# Patient Record
Sex: Female | Born: 1975 | Race: Black or African American | Hispanic: No | Marital: Single | Smoking: Current every day smoker
Health system: Southern US, Community
[De-identification: ages and names within clinical notes are randomized; demographics above are authoritative.]

## PROBLEM LIST (undated history)

## (undated) DIAGNOSIS — F419 Anxiety disorder, unspecified: Secondary | ICD-10-CM

## (undated) DIAGNOSIS — R200 Anesthesia of skin: Secondary | ICD-10-CM

## (undated) DIAGNOSIS — F329 Major depressive disorder, single episode, unspecified: Secondary | ICD-10-CM

## (undated) DIAGNOSIS — G4730 Sleep apnea, unspecified: Secondary | ICD-10-CM

## (undated) DIAGNOSIS — J45909 Unspecified asthma, uncomplicated: Secondary | ICD-10-CM

## (undated) DIAGNOSIS — F32A Depression, unspecified: Secondary | ICD-10-CM

## (undated) DIAGNOSIS — I82409 Acute embolism and thrombosis of unspecified deep veins of unspecified lower extremity: Secondary | ICD-10-CM

## (undated) DIAGNOSIS — J40 Bronchitis, not specified as acute or chronic: Secondary | ICD-10-CM

## (undated) DIAGNOSIS — I10 Essential (primary) hypertension: Secondary | ICD-10-CM

## (undated) HISTORY — DX: Anesthesia of skin: R20.0

## (undated) HISTORY — DX: Depression, unspecified: F32.A

## (undated) HISTORY — PX: TUBAL LIGATION: SHX77

## (undated) HISTORY — DX: Sleep apnea, unspecified: G47.30

## (undated) HISTORY — DX: Unspecified asthma, uncomplicated: J45.909

## (undated) HISTORY — DX: Major depressive disorder, single episode, unspecified: F32.9

## (undated) HISTORY — DX: Anxiety disorder, unspecified: F41.9

## (undated) HISTORY — PX: WRIST SURGERY: SHX841

---

## 2004-11-26 ENCOUNTER — Inpatient Hospital Stay (HOSPITAL_COMMUNITY): Admission: AD | Admit: 2004-11-26 | Discharge: 2004-11-29 | Payer: Self-pay | Admitting: Obstetrics & Gynecology

## 2004-11-28 ENCOUNTER — Encounter (INDEPENDENT_AMBULATORY_CARE_PROVIDER_SITE_OTHER): Payer: Self-pay | Admitting: *Deleted

## 2006-08-11 ENCOUNTER — Emergency Department (HOSPITAL_COMMUNITY): Admission: EM | Admit: 2006-08-11 | Discharge: 2006-08-11 | Payer: Self-pay | Admitting: Family Medicine

## 2007-10-25 ENCOUNTER — Emergency Department (HOSPITAL_COMMUNITY): Admission: EM | Admit: 2007-10-25 | Discharge: 2007-10-25 | Payer: Self-pay | Admitting: Family Medicine

## 2007-11-28 ENCOUNTER — Other Ambulatory Visit: Admission: RE | Admit: 2007-11-28 | Discharge: 2007-11-28 | Payer: Self-pay | Admitting: Endocrinology

## 2010-07-16 ENCOUNTER — Emergency Department (HOSPITAL_COMMUNITY): Admission: EM | Admit: 2010-07-16 | Discharge: 2010-07-16 | Payer: Self-pay | Admitting: Emergency Medicine

## 2010-12-25 LAB — DIFFERENTIAL
Basophils Absolute: 0 10*3/uL (ref 0.0–0.1)
Basophils Relative: 0 % (ref 0–1)
Eosinophils Relative: 2 % (ref 0–5)
Lymphocytes Relative: 26 % (ref 12–46)
Monocytes Absolute: 0.4 10*3/uL (ref 0.1–1.0)
Monocytes Relative: 6 % (ref 3–12)
Neutro Abs: 4.7 10*3/uL (ref 1.7–7.7)

## 2010-12-25 LAB — POCT CARDIAC MARKERS
Myoglobin, poc: 25.9 ng/mL (ref 12–200)
Troponin i, poc: <0.05 ng/mL (ref 0.00–0.09)

## 2010-12-25 LAB — CBC
HCT: 36.6 % (ref 36.0–46.0)
Hemoglobin: 12 g/dL (ref 12.0–15.0)
MCHC: 32.8 g/dL (ref 30.0–36.0)
MCV: 80.6 fL (ref 78.0–100.0)
RDW: 14.2 % (ref 11.5–15.5)

## 2010-12-25 LAB — BASIC METABOLIC PANEL
BUN: 10 mg/dL (ref 6–23)
Chloride: 108 mEq/L (ref 96–112)
GFR calc non Af Amer: >60 mL/min (ref >60)
Glucose, Bld: 120 mg/dL — ABNORMAL HIGH (ref 70–99)
Potassium: 4 mEq/L (ref 3.5–5.1)
Sodium: 137 mEq/L (ref 135–145)

## 2011-02-27 NOTE — Op Note (Signed)
Rachael Paul, Rachael Paul             ACCOUNT NO.:  [id number]   MEDICAL RECORD NO.:  [id number]          PATIENT TYPE:  INP   LOCATION:  9107                          FACILITY:  [facility]   PHYSICIAN:  Roseanna Rainbow, M.D.DATE OF BIRTH:  [birth year]   DATE OF PROCEDURE:  11/28/2004  DATE OF DISCHARGE:                                 OPERATIVE REPORT   PREOPERATIVE DIAGNOSIS:  Multiparity, desires sterilization   POSTOPERATIVE DIAGNOSIS:  Multiparity, desires sterilization   PROCEDURE:  Modified Pomeroy bilateral tubal ligation.   SURGEON:  Roseanna Rainbow, M.D.   ANESTHESIA:  General tracheal.   ESTIMATED BLOOD LOSS:  Less than 50 mL.   COMPLICATIONS:  None.   IV FLUIDS:  As per anesthesiology.   PROCEDURE:  The patient was taken to the operating room.  General anesthesia  was induced without difficulty.  She was placed in the dorsal supine  position and prepped and draped in the usual sterile fashion.  An  infraumbilical skin incision was then made with the scalpel and carried down  to the underlying fascia.  The fascia was tented up and entered sharply with Mayo scissors.  This  incision was then extended bilaterally.  The parietal peritoneum was tented  up and entered sharply.  This incision was then extended as well and the  peritoneal incision was felt to be free of any adhesions.  Retractors were  then placed into the incision.  The right fallopian tube was then grasped  with a Babcock clamp and followed out to the fimbriated end.  The midisthmic  portion of the tube was then regrasped.  Two ligatures of 0 plain were then  placed and the segment of tube excised.  Adequate hemostasis was noted.  The  tube was returned to the abdomen.  The left fallopian tube was manipulated  in a similar fashion.  The fascia and parietal peritoneum were  reapproximated in a single layer using 0 Vicryl in a running fashion.  The  skin was reapproximated in a subcuticular fashion  using 3-0 Vicryl.  At the  close of procedure, the instrument and pack counts were said to be correct  x2.  The patient was taken to the PACU awake and in stable condition.      LAJ/MEDQ  D:  11/28/2004  T:  11/28/2004  Job:  [id number]

## 2011-07-02 LAB — WET PREP, GENITAL

## 2011-07-02 LAB — POCT PREGNANCY, URINE
Operator id: 235561
Preg Test, Ur: NEGATIVE

## 2011-07-02 LAB — POCT URINALYSIS DIP (DEVICE)
Hgb urine dipstick: NEGATIVE
Nitrite: NEGATIVE
Urobilinogen, UA: 0.2
pH: 5.5

## 2011-07-02 LAB — RPR: RPR Ser Ql: NONREACTIVE

## 2011-07-02 LAB — HIV ANTIBODY (ROUTINE TESTING W REFLEX): HIV: NONREACTIVE

## 2011-12-26 ENCOUNTER — Emergency Department (HOSPITAL_COMMUNITY)
Admission: EM | Admit: 2011-12-26 | Discharge: 2011-12-26 | Disposition: A | Discharge status: Home / Self Care | Payer: Self-pay

## 2011-12-26 NOTE — ED Notes (Signed)
Called for the pt with no answer 

## 2011-12-26 NOTE — ED Notes (Signed)
Called pt name to get vital signs and she did not answer.

## 2011-12-26 NOTE — ED Notes (Signed)
Pt does not answer in triage or wait

## 2011-12-27 ENCOUNTER — Emergency Department (INDEPENDENT_AMBULATORY_CARE_PROVIDER_SITE_OTHER)
Admission: EM | Admit: 2011-12-27 | Discharge: 2011-12-27 | Disposition: A | Discharge status: Home / Self Care | Payer: 59 | Source: Home / Self Care | Attending: Family Medicine | Admitting: Family Medicine

## 2011-12-27 ENCOUNTER — Encounter (HOSPITAL_COMMUNITY): Payer: Self-pay

## 2011-12-27 DIAGNOSIS — H6091 Unspecified otitis externa, right ear: Secondary | ICD-10-CM

## 2011-12-27 DIAGNOSIS — H60399 Other infective otitis externa, unspecified ear: Secondary | ICD-10-CM

## 2011-12-27 MED ORDER — NEOMYCIN-POLYMYXIN-HC 3.5-10000-1 OT SOLN: 3.0000 [drp] | Freq: Four times a day (QID) | OTIC | Status: AC

## 2011-12-27 MED ORDER — SULFAMETHOXAZOLE-TRIMETHOPRIM 800-160 MG PO TABS: 1.0000 | ORAL_TABLET | Freq: Two times a day (BID) | ORAL | Status: AC

## 2011-12-27 MED ORDER — IBUPROFEN 600 MG PO TABS: 600.0000 mg | ORAL_TABLET | Freq: Three times a day (TID) | ORAL | Status: AC | PRN

## 2011-12-27 NOTE — ED Provider Notes (Signed)
History     CSN: [id number]  Arrival date & time 12/27/11  1805   First MD Initiated Contact with Patient 12/27/11 1810      Chief Complaint  Patient presents with  . Otalgia    (Consider location/radiation/quality/duration/timing/severity/associated sxs/prior treatment) HPI Comments: 36 year old nondiabetic female here complaining of right ear pain with radiation to the right side of the head and the neck. Decreased hearing on that side. Symptoms present for 3 days. Feels like a throbbing sensation in her right ear canal. States she has used hairpins and Q-tips to remove wax from her ears. No drainage. No fever or chills. Denies sore throat, sinus or cold symptoms.   History reviewed. No pertinent past medical history.  History reviewed. No pertinent past surgical history.  History reviewed. No pertinent family history.  History  Substance Use Topics  . Smoking status: Current Everyday Smoker  . Smokeless tobacco: Not on file  . Alcohol Use: Yes    OB History    Grav Para Term Preterm Abortions TAB SAB Ect Mult Living                  Review of Systems  Constitutional: Negative for chills.  HENT: Positive for ear pain. Negative for congestion, sore throat and sinus pressure.   Neurological: Positive for headaches. Negative for dizziness.    Allergies  Review of patient's allergies indicates no known allergies.  Home Medications   Current Outpatient Rx  Name Route Sig Dispense Refill  . IBUPROFEN 200 MG PO TABS Oral Take 200 mg by mouth every 6 (six) hours as needed.    . IBUPROFEN 600 MG PO TABS Oral Take 1 tablet (600 mg total) by mouth every 8 (eight) hours as needed for pain. 30 tablet 0  . NEOMYCIN-POLYMYXIN-HC 3.5-10000-1 OT SOLN Right Ear Place 3 drops into the right ear 4 (four) times daily. 10 mL 0  . SULFAMETHOXAZOLE-TRIMETHOPRIM 800-160 MG PO TABS Oral Take 1 tablet by mouth 2 (two) times daily. 14 tablet 0    BP 169/85  Pulse 97  Temp(Src) 98 F  (36.7 C) (Oral)  Resp 18  SpO2 100%  LMP 12/14/2011  Physical Exam  Nursing note and vitals reviewed. Constitutional: She is oriented to person, place, and time. She appears well-developed and well-nourished.  HENT:  Head: Normocephalic and atraumatic.  Left Ear: External ear normal.  Nose: Nose normal.  Mouth/Throat: Oropharynx is clear and moist. No oropharyngeal exudate.       Right ear canal is swollen. difficult exam as swelling in narrowing the canal. Tender soft area at the entrance of the ear canal impress a small abscess. No spontaneous drainage no significant erythema or exudates. No pain with palpation over mastoid process.   Eyes: EOM are normal. Pupils are equal, round, and reactive to light.  Cardiovascular: Normal heart sounds.   Pulmonary/Chest: Breath sounds normal.  Lymphadenopathy:    She has no cervical adenopathy.  Neurological: She is alert and oriented to person, place, and time.    ED Course  Procedures (including critical care time)  Labs Reviewed - No data to display No results found.   1. External otitis of right ear       MDM  Ear canal appear swollen but dry. Likely small abscess versus external otitis. Prescribe Septra DS twice a day for 7 days, 600 mg ibuprofen 3 times a day when necessary and Cortisporin otic solution. Asked to return or followup with the ENT if no improvement  of her symptoms in 36-48 hours or return earlier if fever or worsening symptoms despite following treatment.        Sharin Grave, MD 12/29/11 0021

## 2011-12-27 NOTE — ED Notes (Signed)
pt

## 2011-12-27 NOTE — ED Notes (Signed)
Pt has rt sided ear pain for three days, rt side of head and neck aching.

## 2011-12-27 NOTE — Discharge Instructions (Signed)
My impression is that you have an infection like a pimple injure her right ear canal. Use/take the prescribed medications as instructed. In the future avoid cleaning your ears with Q-tips or putting any objects inside your ear canal. Followup with the ENT specialist if worsening pain or swelling after at least 48 hours of treatment. Return earlier if new onset of fever or worsening symptoms despite following treatment.

## 2013-12-04 ENCOUNTER — Other Ambulatory Visit: Payer: Self-pay | Admitting: Physician Assistant

## 2013-12-04 DIAGNOSIS — Z1231 Encounter for screening mammogram for malignant neoplasm of breast: Secondary | ICD-10-CM

## 2013-12-13 ENCOUNTER — Ambulatory Visit
Admission: RE | Admit: 2013-12-13 | Discharge: 2013-12-13 | Disposition: A | Discharge status: Home / Self Care | Payer: 59 | Source: Ambulatory Visit | Attending: Physician Assistant | Admitting: Physician Assistant

## 2013-12-13 DIAGNOSIS — Z1231 Encounter for screening mammogram for malignant neoplasm of breast: Secondary | ICD-10-CM

## 2013-12-14 ENCOUNTER — Other Ambulatory Visit: Payer: Self-pay | Admitting: Physician Assistant

## 2013-12-14 DIAGNOSIS — N63 Unspecified lump in unspecified breast: Secondary | ICD-10-CM

## 2014-01-23 ENCOUNTER — Inpatient Hospital Stay: Admission: RE | Admit: 2014-01-23 | Payer: 59 | Source: Ambulatory Visit

## 2014-02-21 ENCOUNTER — Encounter (HOSPITAL_COMMUNITY): Payer: Self-pay | Admitting: Emergency Medicine

## 2014-02-21 ENCOUNTER — Emergency Department (INDEPENDENT_AMBULATORY_CARE_PROVIDER_SITE_OTHER)
Admission: EM | Admit: 2014-02-21 | Discharge: 2014-02-21 | Disposition: A | Discharge status: Home / Self Care | Payer: 59 | Source: Home / Self Care | Attending: Emergency Medicine | Admitting: Emergency Medicine

## 2014-02-21 DIAGNOSIS — H6690 Otitis media, unspecified, unspecified ear: Secondary | ICD-10-CM

## 2014-02-21 HISTORY — DX: Essential (primary) hypertension: I10

## 2014-02-21 HISTORY — DX: Bronchitis, not specified as acute or chronic: J40

## 2014-02-21 MED ORDER — AMOXICILLIN 500 MG PO CAPS: 500.0000 mg | ORAL_CAPSULE | Freq: Three times a day (TID) | ORAL | Status: DC

## 2014-02-21 NOTE — Discharge Instructions (Signed)
Otitis Media, Adult Otitis media is redness, soreness, and swelling (inflammation) of the middle ear. Otitis media may be caused by allergies or, most commonly, by infection. Often it occurs as a complication of the common cold. SIGNS AND SYMPTOMS Symptoms of otitis media may include:  Earache.  Fever.  Ringing in your ear.  Headache.  Leakage of fluid from the ear. DIAGNOSIS To diagnose otitis media, your health care provider will examine your ear with an otoscope. This is an instrument that allows your health care provider to see into your ear in order to examine your eardrum. Your health care provider also will ask you questions about your symptoms. TREATMENT  Typically, otitis media resolves on its own within 3 5 days. Your health care provider may prescribe medicine to ease your symptoms of pain. If otitis media does not resolve within 5 days or is recurrent, your health care provider may prescribe antibiotic medicines if he or she suspects that a bacterial infection is the cause. HOME CARE INSTRUCTIONS   Take your medicine as directed until it is gone, even if you feel better after the first few days.  Only take over-the-counter or prescription medicines for pain, discomfort, or fever as directed by your health care provider.  Follow up with your health care provider as directed. SEEK MEDICAL CARE IF:  You have otitis media only in one ear or bleeding from your nose or both.  You notice a lump on your neck.  You are not getting better in 3 5 days.  You feel worse instead of better. SEEK IMMEDIATE MEDICAL CARE IF:   You have pain that is not controlled with medicine.  You have swelling, redness, or pain around your ear or stiffness in your neck.  You notice that part of your face is paralyzed.  You notice that the bone behind your ear (mastoid) is tender when you touch it. MAKE SURE YOU:   Understand these instructions.  Will watch your condition.  Will get help  right away if you are not doing well or get worse. Document Released: 07/03/2004 Document Revised: 07/19/2013 Document Reviewed: 04/25/2013 Exit[employer] Patient Information 2014 [employer].  

## 2014-02-21 NOTE — ED Provider Notes (Signed)
CSN: [id number]     Arrival date & time 02/21/14  8768 History   First MD Initiated Contact with Patient 02/21/14 [phone]     No chief complaint on file.  (Consider location/radiation/quality/duration/timing/severity/associated sxs/prior Treatment) Patient is a 38 y.o. female presenting with ear pain. The history is provided by the patient. No language interpreter was used.  Otalgia Location:  Left Severity:  Moderate Onset quality:  Gradual Duration:  4 days Timing:  Constant Progression:  Worsening Chronicity:  New Relieved by:  Nothing Worsened by:  Nothing tried Ineffective treatments:  None tried Associated symptoms: no hearing loss     No past medical history on file. No past surgical history on file. No family history on file. History  Substance Use Topics  . Smoking status: Current Every Day Smoker  . Smokeless tobacco: Not on file  . Alcohol Use: Yes   OB History   Grav Para Term Preterm Abortions TAB SAB Ect Mult Living                 Review of Systems  HENT: Positive for ear pain. Negative for hearing loss.   All other systems reviewed and are negative.   Allergies  Review of patient's allergies indicates no known allergies.  Home Medications   Prior to Admission medications   Medication Sig Start Date End Date Taking? Authorizing Provider  ibuprofen (ADVIL,MOTRIN) 200 MG tablet Take 200 mg by mouth every 6 (six) hours as needed.    Historical Provider, MD   BP 181/106  Pulse 88  Temp(Src) 98.4 F (36.9 C) (Oral)  Resp 18  SpO2 100% Physical Exam  Nursing note and vitals reviewed. Constitutional: She is oriented to person, place, and time. She appears well-developed and well-nourished.  HENT:  Head: Normocephalic.  Right Ear: External ear normal.  Mouth/Throat: Oropharynx is clear and moist.  lefttm erythematous ring, pimple in canal  Eyes: Conjunctivae and EOM are normal. Pupils are equal, round, and reactive to light.  Neck: Normal range of  motion.  Cardiovascular: Normal rate.   Pulmonary/Chest: Effort normal.  Musculoskeletal: Normal range of motion.  Neurological: She is alert and oriented to person, place, and time.  Psychiatric: She has a normal mood and affect.    ED Course  Procedures (including critical care time) Labs Review Labs Reviewed - No data to display  Imaging Review No results found.   MDM   1. Otitis media    Emlyn, PA-C 02/21/14 1024

## 2014-02-21 NOTE — ED Provider Notes (Signed)
Medical screening examination/treatment/procedure(s) were performed by non-physician practitioner and as supervising physician I was immediately available for consultation/collaboration.  Philipp Deputy, M.D.  Harden Mo, MD 02/21/14 1058

## 2014-02-21 NOTE — ED Notes (Signed)
C/o ear ache, onset 2 weeks ago

## 2015-04-04 ENCOUNTER — Encounter (HOSPITAL_COMMUNITY): Payer: Self-pay | Admitting: *Deleted

## 2015-04-04 ENCOUNTER — Inpatient Hospital Stay (HOSPITAL_COMMUNITY)
Admission: AD | Admit: 2015-04-04 | Discharge: 2015-04-06 | DRG: 885 | Disposition: A | Discharge status: Home / Self Care | Payer: Federal, State, Local not specified - Other | Attending: Psychiatry | Admitting: Psychiatry

## 2015-04-04 DIAGNOSIS — F322 Major depressive disorder, single episode, severe without psychotic features: Secondary | ICD-10-CM | POA: Diagnosis present

## 2015-04-04 DIAGNOSIS — I10 Essential (primary) hypertension: Secondary | ICD-10-CM | POA: Diagnosis present

## 2015-04-04 DIAGNOSIS — F17210 Nicotine dependence, cigarettes, uncomplicated: Secondary | ICD-10-CM | POA: Diagnosis present

## 2015-04-04 DIAGNOSIS — R45851 Suicidal ideations: Secondary | ICD-10-CM | POA: Diagnosis present

## 2015-04-04 DIAGNOSIS — Z634 Disappearance and death of family member: Secondary | ICD-10-CM | POA: Insufficient documentation

## 2015-04-04 DIAGNOSIS — F329 Major depressive disorder, single episode, unspecified: Secondary | ICD-10-CM | POA: Diagnosis present

## 2015-04-04 MED ORDER — TRAZODONE HCL 100 MG PO TABS
100.0000 mg | ORAL_TABLET | Freq: Every evening | ORAL | Status: DC | PRN
  Administered 2015-04-04 – 2015-04-05 (×2): 100 mg via ORAL
  Filled 2015-04-04 (×2): qty 1

## 2015-04-04 MED ORDER — NICOTINE 21 MG/24HR TD PT24
21.0000 mg | MEDICATED_PATCH | Freq: Every day | TRANSDERMAL | Status: DC
  Administered 2015-04-04 – 2015-04-06 (×3): 21 mg via TRANSDERMAL
  Filled 2015-04-04 (×7): qty 1

## 2015-04-04 MED ORDER — ALUM & MAG HYDROXIDE-SIMETH 200-200-20 MG/5ML PO SUSP: 30.0000 mL | ORAL | Status: DC | PRN

## 2015-04-04 MED ORDER — HYDROXYZINE HCL 25 MG PO TABS
25.0000 mg | ORAL_TABLET | Freq: Three times a day (TID) | ORAL | Status: DC | PRN
  Administered 2015-04-04 – 2015-04-05 (×2): 25 mg via ORAL
  Filled 2015-04-04: qty 1
  Filled 2015-04-04: qty 20
  Filled 2015-04-04: qty 1

## 2015-04-04 MED ORDER — ACETAMINOPHEN 325 MG PO TABS: 650.0000 mg | ORAL_TABLET | Freq: Four times a day (QID) | ORAL | Status: DC | PRN

## 2015-04-04 MED ORDER — MAGNESIUM HYDROXIDE 400 MG/5ML PO SUSP: 30.0000 mL | Freq: Every day | ORAL | Status: DC | PRN

## 2015-04-04 NOTE — Tx Team (Signed)
Initial Interdisciplinary Treatment Plan   PATIENT STRESSORS: Health problems Loss of son who died recently Medication change or noncompliance Substance abuse   PATIENT STRENGTHS: Ability for insight Capable of independent living Occupational psychologist fund of knowledge Motivation for treatment/growth Religious Affiliation Supportive family/friends Work skills   PROBLEM LIST: Problem List/Patient Goals Date to be addressed Date deferred Reason deferred Estimated date of resolution  "I need help with depression and anxiety" 04-04-15     "I drink couple times a week" 04-04-15                                                DISCHARGE CRITERIA:  Ability to meet basic life and health needs Improved stabilization in mood, thinking, and/or behavior Medical problems require only outpatient monitoring Verbal commitment to aftercare and medication compliance  PRELIMINARY DISCHARGE PLAN: Attend aftercare/continuing care group Outpatient therapy Participate in family therapy Return to previous living arrangement Return to previous work or school arrangements  PATIENT/FAMIILY INVOLVEMENT: This treatment plan has been presented to and reviewed with the patient, Rachael Paul, and/or family member.  The patient and family have been given the opportunity to ask questions and make suggestions.  Rachael Paul 04/04/2015, 8:40 PM

## 2015-04-04 NOTE — Progress Notes (Signed)
Patient ID: Rachael Paul, female   DOB: [birth year], 39 y.o.   MRN: [id number] Client is 39 yo female admitted voluntarily with complaints " I need help with depression and anxiety" client reports her 3 year old son died in 03-13-23 unexpectedly, reports show he died of a blood clot in his leg. Client is grieving his death. She has 3 other children 21, 57, 11 with whom she lives. Client has a medical history of HTN and bronchitis. This client first admission to [facility].  Client reports she drinks 2-3 glasses of wine couple times a week. Client denies SHI. Client received food/drink, oriented to unit/room. Staff will monitor q45min for safety.

## 2015-04-04 NOTE — Progress Notes (Signed)
Pt did not attend karaoke group this evening.  

## 2015-04-04 NOTE — BH Assessment (Signed)
Assessment Note  Rachael Paul is an 39 y.o. female that reports SI.  Patient reports that her son died on 2015-02-18 due to a blood clot in his leg.  Patient reports that she has been tearful and not able to work.  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  Patient reports that she has 3 other children ages 65,16 and 52.   Patient reports feelings of hopelessness.  Patient reports that she was prescribed prozac by her primary care physician.  Patient reports that she has been see by the EAP counselor at work.  Patient reports that she is having difficulty with managing day to day activities.  Patient reports that she is not able to sleep without some type of sleeping medication.    Patient denies HI/Psychosis/Substance Abuse.  Patient denies physical, sexual or emotional abuse.     Axis I: Major Depression, single episode Axis II: Deferred Axis III:  Past Medical History  Diagnosis Date  . Hypertension   . Bronchitis    Axis IV: other psychosocial or environmental problems and problems related to social environment Axis V: 31-40 impairment in reality testing  Past Medical History:  Past Medical History  Diagnosis Date  . Hypertension   . Bronchitis     No past surgical history on file.  Family History: No family history on file.  Social History:  reports that she has been smoking.  She does not have any smokeless tobacco history on file. She reports that she drinks alcohol. She reports that she does not use illicit drugs.  Additional Social History:  Alcohol / Drug Use History of alcohol / drug use?: No history of alcohol / drug abuse  CIWA:   COWS:    Allergies: No Known Allergies  Home Medications:  Medications Prior to Admission  Medication Sig Dispense Refill  . amoxicillin (AMOXIL) 500 MG capsule Take 1 capsule (500 mg total) by mouth 3 (three) times daily. 30 capsule 0  . ibuprofen (ADVIL,MOTRIN) 200 MG tablet  Take 200 mg by mouth every 6 (six) hours as needed.      OB/GYN Status:  No LMP recorded.  General Assessment Data Location of Assessment: [facility] ([facility]) TTS Assessment: In system Is this a Tele or Face-to-Face Assessment?: Face-to-Face Is this an Initial Assessment or a Re-assessment for this encounter?: Initial Assessment Marital status: Single Maiden name: NA Is patient pregnant?: No Pregnancy Status: No Living Arrangements: Children Can pt return to current living arrangement?: Yes Admission Status: Voluntary Is patient capable of signing voluntary admission?: Yes Referral Source: Self/Family/Friend Insurance type: Medicaid  Medical Screening Exam ([place]) Medical Exam completed: Yes  Crisis Care Plan Living Arrangements: Children Name of Psychiatrist: NA Name of Therapist: NA  Education Status Is patient currently in school?: No Current Grade: NA Highest grade of school patient has completed: NA Name of school: NA Contact person: NA  Risk to self with the past 6 months Suicidal Ideation: Yes-Currently Present Has patient been a risk to self within the past 6 months prior to admission? : No Suicidal Intent: Yes-Currently Present Has patient had any suicidal intent within the past 6 months prior to admission? : No Is patient at risk for suicide?: Yes Suicidal Plan?: No Has patient had any suicidal plan within the past 6 months prior to admission? : No Specify Current Suicidal Plan: NA Access to Means: No What has been your use  of drugs/alcohol within the last 12 months?: NA Previous Attempts/Gestures: No How many times?: 0 Other Self Harm Risks: NA Triggers for Past Attempts: None known Intentional Self Injurious Behavior: None Family Suicide History: No Recent stressful life event(s): Loss (Comment) (Son died on 12-Feb-2015) Persecutory voices/beliefs?: No Depression: Yes Depression Symptoms: Despondent, Insomnia,  Tearfulness, Isolating, Guilt, Fatigue, Loss of interest in usual pleasures, Feeling worthless/self pity Substance abuse history and/or treatment for substance abuse?: No Suicide prevention information given to non-admitted patients: Yes  Risk to Others within the past 6 months Homicidal Ideation: No Does patient have any lifetime risk of violence toward others beyond the six months prior to admission? : No Thoughts of Harm to Others: No Current Homicidal Intent: No Current Homicidal Plan: No Access to Homicidal Means: No Identified Victim: NA History of harm to others?: No Assessment of Violence: None Noted Violent Behavior Description: NA Does patient have access to weapons?: No Criminal Charges Pending?: No Does patient have a court date: No Is patient on probation?: No  Psychosis Hallucinations: None noted Delusions: None noted  Mental Status Report Appearance/Hygiene: Disheveled Eye Contact: Poor Motor Activity: Freedom of movement, Restlessness Speech: Logical/coherent, Slow Level of Consciousness: Alert, Restless Mood: Depressed, Anxious Affect: Anxious, Depressed Anxiety Level: Minimal Thought Processes: Coherent, Relevant Judgement: Unimpaired Orientation: Person, Place, Time, Situation Obsessive Compulsive Thoughts/Behaviors: None  Cognitive Functioning Concentration: Decreased Memory: Remote Intact, Recent Intact IQ: Average Insight: Fair Impulse Control: Poor Appetite: Fair Weight Loss: 0 Weight Gain: 0 Sleep: Increased Total Hours of Sleep: 11 Vegetative Symptoms: Decreased grooming, Staying in bed  ADLScreening [facility] Assessment Services) Patient's cognitive ability adequate to safely complete daily activities?: Yes Patient able to express need for assistance with ADLs?: Yes Independently performs ADLs?: Yes (appropriate for developmental age)  Prior Inpatient Therapy Prior Inpatient Therapy: No Prior Therapy Dates: NA Prior Therapy  Facilty/Provider(s): NA Reason for Treatment: NA  Prior Outpatient Therapy Prior Outpatient Therapy: No Prior Therapy Dates: NA Prior Therapy Facilty/Provider(s): NA Reason for Treatment: NA Does patient have an ACCT team?: No Does patient have Intensive In-House Services?  : No Does patient have [facility] services? : No Does patient have P4CC services?: No  ADL Screening (condition at time of admission) Patient's cognitive ability adequate to safely complete daily activities?: Yes Is the patient deaf or have difficulty hearing?: No Does the patient have difficulty seeing, even when wearing glasses/contacts?: No Does the patient have difficulty concentrating, remembering, or making decisions?: No Patient able to express need for assistance with ADLs?: Yes Does the patient have difficulty dressing or bathing?: No Independently performs ADLs?: Yes (appropriate for developmental age) Does the patient have difficulty walking or climbing stairs?: No Weakness of Legs: None Weakness of Arms/Hands: None  Home Assistive Devices/Equipment Home Assistive Devices/Equipment: None    Abuse/Neglect Assessment (Assessment to be complete while patient is alone) Physical Abuse: Denies Verbal Abuse: Denies Sexual Abuse: Denies Exploitation of patient/patient's resources: Denies Self-Neglect: Denies Values / Beliefs Cultural Requests During Hospitalization: None Spiritual Requests During Hospitalization: None Consults Spiritual Care Consult Needed: No Social Work Consult Needed: No Regulatory affairs officer (For Healthcare) Does patient have an advance directive?: No Would patient like information on creating an advanced directive?: No - patient declined information    Additional Information 1:1 In Past 12 Months?: No CIRT Risk: No Elopement Risk: No Does patient have medical clearance?: Yes     Disposition: Per [place], NP - meets criteria for inpatient hospitalization. Disposition Initial  Assessment Completed for this Encounter:  Yes Disposition of Patient: Inpatient treatment program (Patient meets criteria for inpt hosp )  On Site Evaluation by:   Reviewed with Physician:    Graciella Freer [place] 04/04/2015 7:00 PM

## 2015-04-05 ENCOUNTER — Encounter (HOSPITAL_COMMUNITY): Payer: Self-pay | Admitting: Psychiatry

## 2015-04-05 LAB — TSH: TSH: 2.81 u[IU]/mL (ref 0.350–4.500)

## 2015-04-05 MED ORDER — FLUOXETINE HCL 20 MG PO CAPS
20.0000 mg | ORAL_CAPSULE | Freq: Every day | ORAL | Status: DC
  Administered 2015-04-05 – 2015-04-06 (×2): 20 mg via ORAL
  Filled 2015-04-05 (×5): qty 1

## 2015-04-05 MED ORDER — LISINOPRIL 20 MG PO TABS
20.0000 mg | ORAL_TABLET | Freq: Every day | ORAL | Status: DC
  Administered 2015-04-05 – 2015-04-06 (×2): 20 mg via ORAL
  Filled 2015-04-05 (×5): qty 1

## 2015-04-05 MED ORDER — HYDROCHLOROTHIAZIDE 25 MG PO TABS
25.0000 mg | ORAL_TABLET | Freq: Every day | ORAL | Status: DC
  Administered 2015-04-05 – 2015-04-06 (×2): 25 mg via ORAL
  Filled 2015-04-05 (×5): qty 1
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
## 2015-04-05 NOTE — H&P (Addendum)
Psychiatric Admission Assessment Adult  Patient Identification: Rachael Paul MRN:  [id number] Date of Evaluation:  04/05/2015 Chief Complaint:  Depressed Principal Diagnosis: <principal problem not specified> Diagnosis:   Patient Active Problem List   Diagnosis Date Noted  . Major depressive disorder, single episode, severe [F32.2] 04/04/2015   History of Present Illness:: 39 year old single female, who has been experiencing severe depression and grief after her 40 year old son passed away on March 13, 2015 from a DVT/PE. She states she even prior to his death she had some relatively mild depression and anxiety, but generally was high functioning and able to manage her job and daily life well. After his passing, she has been more depressed, states that it is very difficult for her to function, even in daily activities, and she feels " very overwhelmed by everything. I keep on thinking that maybe this could have been prevented , and now I worry all the time about my other kids ". States that after he died she had been having suicidal ideations, and feelings of " wanting to die so I could be with him", but states that  More recently she has not had any plan or intention of hurting herself " because I know I need to be there for my other kids ".  Elements:  Worsening depression, sadness ( currently severe) , and  Suicidal ideations due to unexpected death of her 66 year old son about two months ago.  Associated Signs/Symptoms: Depression Symptoms:  depressed mood, anhedonia, insomnia, recurrent thoughts of death, anxiety, loss of energy/fatigue, weight loss, decreased appetite,  She also describes a subjective sense of guilt , thinking "maybe there was something I missed or could have done ". (Hypo) Manic Symptoms:  denies  Anxiety Symptoms:  Significant subjective sense of anxiety. No panic attacks reported  Psychotic Symptoms:  denies, states that he has briefly heard his voice " a  couple of times " PTSD Symptoms: Reports vivid visual  recollections  Of day son died, and states she was there through EMS trying to resucitate him . 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  No history of cutting , no history of suicide attempts, no prior history of psychosis, no history of mania, had not been on psychiatric medications other than Zoloft (?) which she took briefly 20 years ago. She states she was started on Prozac/Ambien  by her PCP  About two weeks ago.  Describes a history of worrying excessively, occasional panic attacks . Denies violence, states " generally I am  A very relaxed person". Past Medical History:   Past Medical History  Diagnosis Date  . Hypertension   . Bronchitis   History of Tubal ligation  Family History: Mother passed away when patient was 75, from TB, States she never met father, has two brothers , one sister, denies mental illness in family. Mother had history of opiate and cocaine dependence   Social History:  Single, has three surviving children, ages 107,16,12. No significant other at this time. She is employed by [employer], and has had a job there for 10 years, she states she last worked on day prior to son's death, and  Currently applying for short term disability. (+) financial difficulties . States her children are currently with extended family. History  Alcohol Use  . 1.2 - 1.8 oz/week  . 2-3 Glasses of wine per  week     History  Drug Use No    History   Social History  . Marital Status: Single    Spouse Name: N/A  . Number of Children: N/A  . Years of Education: N/A   Social History Main Topics  . Smoking status: Current Every Day Smoker -- 1.50 packs/day    Types: Cigarettes  . Smokeless tobacco: Not on file  . Alcohol Use: 1.2 - 1.8 oz/week    2-3 Glasses of wine per week  . Drug Use: No  . Sexual  Activity: Not Currently   Other Topics Concern  . None   Social History Narrative   Additional Social History:    History of alcohol / drug use?: Yes  Musculoskeletal: Strength & Muscle Tone: within normal limits Gait & Station: normal Patient leans: N/A  Psychiatric Specialty Exam: Physical Exam  Review of Systems  Constitutional: Negative.   Eyes: Negative.   Respiratory: Negative.   Cardiovascular: Negative.   Gastrointestinal: Negative.   Genitourinary: Negative.   Musculoskeletal: Negative.   Skin: Negative.   Neurological: Positive for headaches. Negative for seizures.  Endo/Heme/Allergies: Negative.   Psychiatric/Behavioral: Positive for depression and suicidal ideas. The patient is nervous/anxious.   all other systems negative   Blood pressure 142/91, pulse 93, temperature 98.9 F (37.2 C), temperature source Oral, resp. rate 18, height $RemoveBe'5\' 3"'nMbxmUEaE$  (1.6 m), weight 187 lb (84.823 kg), last menstrual period 04/03/2015.Body mass index is 33.13 kg/(m^2).  General Appearance: Fairly Groomed  Engineer, water::  Good  Speech:  Normal Rate  Volume:  Normal  Mood:  Depressed  Affect:  labile, occasionally tearful when discussing loss of son  Thought Process:  Goal Directed and Linear  Orientation:  Full (Time, Place, and Person)  Thought Content:  no hallucinations, no delusions, not internally preoccupied   Suicidal Thoughts:  No- at this time denies any thoughts of suicide or hurting self   Homicidal Thoughts:  No  Memory:  recent and remote grossly intact   Judgement:  Fair  Insight:  Present  Psychomotor Activity:  Normal  Concentration:  Good  Recall:  Good  Fund of Knowledge:Good  Language: Good  Akathisia:  Negative  Handed:  Right  AIMS (if indicated):     Assets:  Communication Skills Desire for Improvement Physical Health Resilience  ADL's:  Fair   Cognition: WNL  Sleep:  Number of Hours: 5.75   Risk to Self: Suicidal Ideation: Yes-Currently  Present Suicidal Intent: Yes-Currently Present Is patient at risk for suicide?: Yes Suicidal Plan?: No Specify Current Suicidal Plan: NA Access to Means: No What has been your use of drugs/alcohol within the last 12 months?: Drinks alcohol on ocassion How many times?: 0 Other Self Harm Risks: NA Triggers for Past Attempts: None known Intentional Self Injurious Behavior: None Risk to Others: Homicidal Ideation: No Thoughts of Harm to Others: No Current Homicidal Intent: No Current Homicidal Plan: No Access to Homicidal Means: No Identified Victim: NA History of harm to others?: No Assessment of Violence: None Noted Violent Behavior Description: NA Does patient have access to weapons?: No Criminal Charges Pending?: No Does patient have a court date: No Prior Inpatient Therapy: Prior Inpatient Therapy: No Prior Therapy Dates: NA Prior Therapy Facilty/Provider(s): NA Reason for Treatment: NA Prior Outpatient Therapy: Prior Outpatient Therapy: No Prior Therapy Dates: NA Prior Therapy Facilty/Provider(s): NA Reason for Treatment: NA Does patient have an ACCT team?: No Does patient have Intensive In-House Services?  : No Does patient  have [facility] services? : No Does patient have P4CC services?: No  Alcohol Screening: Patient refused Alcohol Screening Tool: Yes 1. How often do you have a drink containing alcohol?: 2 to 3 times a week 2. How many drinks containing alcohol do you have on a typical day when you are drinking?: 3 or 4 3. How often do you have six or more drinks on one occasion?: Never Preliminary Score: 1 4. How often during the last year have you found that you were not able to stop drinking once you had started?: Never 5. How often during the last year have you failed to do what was normally expected from you becasue of drinking?: Never 6. How often during the last year have you needed a first drink in the morning to get yourself going after a heavy drinking session?:  Never 7. How often during the last year have you had a feeling of guilt of remorse after drinking?: Never 8. How often during the last year have you been unable to remember what happened the night before because you had been drinking?: Never 9. Have you or someone else been injured as a result of your drinking?: No 10. Has a relative or friend or a doctor or another health worker been concerned about your drinking or suggested you cut down?: No Alcohol Use Disorder Identification Test Final Score (AUDIT): 4 Brief Intervention: Patient declined brief intervention  Allergies:  No Known Allergies Lab Results:  Results for orders placed or performed during the hospital encounter of 04/04/15 (from the past 48 hour(s))  TSH     Status: None   Collection Time: 04/05/15  6:47 AM  Result Value Ref Range   TSH 2.810 0.350 - 4.500 uIU/mL    Comment: Performed at [facility]   Current Medications: Current Facility-Administered Medications  Medication Dose Route Frequency Provider Last Rate Last Dose  . acetaminophen (TYLENOL) tablet 650 mg  650 mg Oral Q6H PRN Harriet Butte, NP      . alum & mag hydroxide-simeth (MAALOX/MYLANTA) 200-200-20 MG/5ML suspension 30 mL  30 mL Oral Q4H PRN Harriet Butte, NP      . hydrOXYzine (ATARAX/VISTARIL) tablet 25 mg  25 mg Oral TID PRN Harriet Butte, NP   25 mg at 04/04/15 2226  . magnesium hydroxide (MILK OF MAGNESIA) suspension 30 mL  30 mL Oral Daily PRN Harriet Butte, NP      . nicotine (NICODERM CQ - dosed in mg/24 hours) patch 21 mg  21 mg Transdermal Daily Jenne Campus, MD   21 mg at 04/05/15 0757  . traZODone (DESYREL) tablet 100 mg  100 mg Oral QHS PRN Harriet Butte, NP   100 mg at 04/04/15 2227   PTA Medications: Prescriptions prior to admission  Medication Sig Dispense Refill Last Dose  . amoxicillin (AMOXIL) 500 MG capsule Take 1 capsule (500 mg total) by mouth 3 (three) times daily. 30 capsule 0   . ibuprofen  (ADVIL,MOTRIN) 200 MG tablet Take 200 mg by mouth every 6 (six) hours as needed.       Previous Psychotropic Medications:States she had been on an antidepressant in the remote  past , but unsure what it was, thinks may be it was Zoloft or Paxil. States she was started on Prozac  2-3 weeks ago.  Substance Abuse History in the last 12 months:  No. denies alcohol abuse or dependence , states drinks 2 glasses of wine 2 x a week.  Remote history of cocaine abuse, but has not used in many years. At this time no drug abuse or dependence     Consequences of Substance Abuse: States that psychiatric admission 20+ years ago, was related to alcohol and cocaine intoxication. History of DUI.  Results for orders placed or performed during the hospital encounter of 04/04/15 (from the past 72 hour(s))  TSH     Status: None   Collection Time: 04/05/15  6:47 AM  Result Value Ref Range   TSH 2.810 0.350 - 4.500 uIU/mL    Comment: Performed at [facility]    Observation Level/Precautions:  15 minute checks  Laboratory:  if needed   Psychotherapy:  Support, milieu, group therapy    Medications:  Continue PROZAC 20 mgrs QAM- also , will restart HCTZ 25 mgrs a day, LISINOPRIL 20 mgrs a day, which she has been taking to manage HTN.   Consultations:  If needed   Discharge Concerns:  Acute grief related to loss of son  Estimated LOS: 5-6 days   Other:     Psychological Evaluations - no   Treatment Plan Summary: Daily contact with patient to assess and evaluate symptoms and progress in treatment, Medication management, Plan inpatient admission and medications as above   Medical Decision Making:  Review of Psycho-Social Stressors (1), Review or order clinical lab tests (1), Established Problem, Worsening (2) and Review of New Medication or Change in Dosage (2)  I certify that inpatient services furnished can reasonably be expected to improve the patient's condition.   Gabor Lusk,  Felicita Gage 6/24/201611:52 AM

## 2015-04-05 NOTE — Tx Team (Signed)
Interdisciplinary Treatment Plan Update (Adult)  Date:  04/05/2015  Time Reviewed:  8:51 AM   Progress in Treatment: Attending groups: Patient is attending groups. Participating in groups:  Patient engages in discussion Taking medication as prescribed:  Patient is taking medications Tolerating medication:  Patient is tolerating medications Family/Significant othe contact made:   No, patient declined  collateral contact Patient understands diagnosis:Yes, patient understands diagnosis and need for treatment Discussing patient identified problems/goals with staff:  Yes, patient is able to express goals/problems Medical problems stabilized or resolved:  Yes Denies suicidal/homicidal ideation: Yes, patient is denying SI/HI. Issues/concerns per patient self-inventory:   Other:  Discharge Plan or Barriers: Home with outpatient follow at [facility]  Reason for Continuation of Hospitalization: Anxiety Depression Medication stabilization  Comments:  Additional comments:  Patient and CSW reviewed Patient Discharge Process Letter/Patient Involvement Form.  Patient verbalized understanding and signed form.  Patient and CSW also reviewed and identified patient's goals and treatment plan.  Patient verbalized understanding and agreed to plan.  Estimated length of stay: 2 - 3 days  New goal(s):  Review of initial/current patient goals per problem list:  Please see plan of careInterdisciplinary Treatment Plan Update (Adult)  Attendees: Patient 04/05/2015 8:51 AM   Family:   04/05/2015 8:51 AM   Physician:  Neita Garnet, MD 04/05/2015 8:51 AM   Nursing:   Charlyne Quale, RN 04/05/2015 8:51 AM   Clinical Social Worker:  Joette Catching, LCSW 04/05/2015 8:51 AM   Clinical Social Worker:  Tilden Fossa, LCSW-A 04/05/2015 8:51 AM   Case Manager:  Lars Pinks, RN 04/05/2015 8:51 AM   Other:  Reyes Ivan, RN 04/05/2015 8:51 AM  Other:   04/05/2015  8:51 AM   Other:  04/05/2015  8:51 AM   Other:  04/05/2015 8:51 AM   Other:  04/05/2015 8:51 AM   Other:  Jake Bathe Transition Team Coordinator 04/05/2015 8:51 AM   Other:   04/05/2015 8:51 AM   Other:  04/05/2015 8:51 AM   Other:   04/05/2015 8:51 AM    Scribe for Treatment Team:   Concha Pyo, 04/05/2015   8:51 AM

## 2015-04-05 NOTE — Progress Notes (Signed)
Recreation Therapy Notes  Date: 06.24.16 Time: 9:30 am Location: [address] Group Room  Group Topic: Stress Management  Goal Area(s) Addresses:  Patient will verbalize importance of using healthy stress management.  Patient will identify positive emotions associated with healthy stress management.   Intervention: Stress Management  Activity :  Progressive Muscle Relaxation.  LRT introduced and educated patients on the stress management technique of progressive muscle relaxation.  A script was used to deliver the technique to patients.  Patients were asked to follow the script read aloud by LRT to engage in practicing the stress management techniques.  Education:  Stress Management, Discharge Planning.   Education Outcome: Acknowledges edcuation/In group clarification offered/Needs additional education  Clinical Observations/Feedback: Patient did not attend group.   Victorino Sparrow, LRT/CTRS         Ria Comment, Keryn Nessler A 04/05/2015 1:22 PM

## 2015-04-05 NOTE — Progress Notes (Signed)
D.  Pt pleasant on approach, guarded.  Denies complaints at this time.  Positive for evening wrap up group.  Pt does not feel she needs to be here, has signed 72 hour request for discharge.  Pt was looking for grief counseling and outpatient services.  Pt denies SI/HI/hallucinations at this time.  A.  Support and encouragement offered  R.  Pt remains safe on unit, will continue to monitor.

## 2015-04-05 NOTE — BHH Group Notes (Signed)
[facility] LCSW Aftercare Discharge Planning Group Note   04/05/2015 11:32 AM  Participation Quality:  Patient refused to answer questions in group.  Rachael Paul, Eulas Post

## 2015-04-05 NOTE — BHH Group Notes (Signed)
[facility] LCSW Group Therapy  Feelings Around Relapse  04/05/2015 2:58 PM  Type of Therapy:  Group Therapy  Participation Level:  Did Not Attend - patient left group as it was starting   Rachael Paul 04/05/2015, 2:58 PM

## 2015-04-05 NOTE — Plan of Care (Signed)
Problem: Alteration in mood & ability to function due to Goal: LTG-Pt reports reduction in suicidal thoughts (Patient reports reduction in suicidal thoughts and is able to verbalize a safety plan for whenever patient is feeling suicidal)  Outcome: Progressing Patient denies having any suicidal thoughts today.  Problem: Ineffective individual coping Goal: STG: Patient will remain free from self harm Outcome: Progressing Patient remains free from self harm. 15 minute checks continued per protocol for patient safety.

## 2015-04-05 NOTE — Progress Notes (Signed)
Pt stated that she had a good day. Still kind of confused as to why she is here, she only wanted outpatient services.

## 2015-04-05 NOTE — Progress Notes (Signed)
D: Patient is alert and oriented. Pt's mood and affect is depressed and blunted. Pt denies SI/HI and AVH. Pt rates depression and anxiety both 10/10, hopelessness 8/10. Pt reports her goal for the day is "dealing with a way to help with my grief and feelings, so I can be discharge with outpatient care." Pt is experiencing HTN today, denies symptoms (See docflowsheet-vitals). Pt is attending unit groups today. A: Active listening by RN. Encouragement/Support provided to pt. Will reassess BP frequently, MD Cobos made aware of pt's HTN. Medication education reviewed with pt. Scheduled medications administered per providers orders (See MAR). 15 minute checks continued per protocol for patient safety.  R: Patient cooperative and receptive to nursing interventions. Pt remains safe.

## 2015-04-05 NOTE — BHH Suicide Risk Assessment (Signed)
[facility] Admission Suicide Risk Assessment   Nursing information obtained from:  Patient Demographic factors:  10 year single female, four children, employed  Current Mental Status: See below  Loss Factors:  Loss of significant relationship Historical Factors:  Prior depression Risk Reduction Factors:  Responsible for children under 40 years of age, Sense of responsibility to family, Religious beliefs about death, Employed, Positive social support Total Time spent with patient: 45 minutes Principal Problem: Major depressive disorder, single episode, severe Diagnosis:   Patient Active Problem List   Diagnosis Date Noted  . Major depressive disorder, single episode, severe [F32.2] 04/04/2015     Continued Clinical Symptoms:  Alcohol Use Disorder Identification Test Final Score (AUDIT): 4 The "Alcohol Use Disorders Identification Test", Guidelines for Use in Primary Care, Second Edition.  World Pharmacologist [facility]). Score between 0-7:  no or low risk or alcohol related problems. Score between 8-15:  moderate risk of alcohol related problems. Score between 16-19:  high risk of alcohol related problems. Score 20 or above:  warrants further diagnostic evaluation for alcohol dependence and treatment.   CLINICAL FACTORS:  39 year old female, mother of 4 children. She became severely depressed and overwhelmed after her 74 year old son passed away in 03/22/15.    Musculoskeletal: Strength & Muscle Tone: within normal limits Gait & Station: normal Patient leans: N/A  Psychiatric Specialty Exam: Physical Exam  ROS  Blood pressure 142/91, pulse 93, temperature 98.9 F (37.2 C), temperature source Oral, resp. rate 18, height 5\' 3"  (1.6 m), weight 187 lb (84.823 kg), last menstrual period 04/03/2015.Body mass index is 33.13 kg/(m^2).  See admit note MSE                                                        COGNITIVE FEATURES THAT CONTRIBUTE TO RISK:  Loss of  executive function    SUICIDE RISK:   Moderate:  Frequent suicidal ideation with limited intensity, and duration, some specificity in terms of plans, no associated intent, good self-control, limited dysphoria/symptomatology, some risk factors present, and identifiable protective factors, including available and accessible social support.  PLAN OF CARE: Patient will be admitted to inpatient psychiatric unit for stabilization and safety. Will provide and encourage milieu participation. Provide medication management and maked adjustments as needed.  Will follow daily.    Medical Decision Making:  Review of Psycho-Social Stressors (1), Review or order clinical lab tests (1), Established Problem, Worsening (2) and Review of Medication Regimen & Side Effects (2)  I certify that inpatient services furnished can reasonably be expected to improve the patient's condition.   COBOS, FERNANDO 04/05/2015, 12:30 PM

## 2015-04-06 ENCOUNTER — Encounter (HOSPITAL_COMMUNITY): Payer: Self-pay | Admitting: Registered Nurse

## 2015-04-06 DIAGNOSIS — Z634 Disappearance and death of family member: Secondary | ICD-10-CM | POA: Insufficient documentation

## 2015-04-06 DIAGNOSIS — F322 Major depressive disorder, single episode, severe without psychotic features: Principal | ICD-10-CM

## 2015-04-06 LAB — BASIC METABOLIC PANEL
Anion gap: 8 (ref 5–15)
BUN: 12 mg/dL (ref 6–20)
CALCIUM: 8.8 mg/dL — AB (ref 8.9–10.3)
CO2: 23 mmol/L (ref 22–32)
Chloride: 105 mmol/L (ref 101–111)
Creatinine, Ser: 0.71 mg/dL (ref 0.44–1.00)
Glucose, Bld: 111 mg/dL — ABNORMAL HIGH (ref 65–99)
POTASSIUM: 3.6 mmol/L (ref 3.5–5.1)
Sodium: 136 mmol/L (ref 135–145)

## 2015-04-06 LAB — CBC WITH DIFFERENTIAL/PLATELET
BASOS ABS: 0 10*3/uL (ref 0.0–0.1)
BASOS PCT: 0 % (ref 0–1)
EOS PCT: 3 % (ref 0–5)
Eosinophils Absolute: 0.1 10*3/uL (ref 0.0–0.7)
HCT: 35.5 % — ABNORMAL LOW (ref 36.0–46.0)
Hemoglobin: 11.2 g/dL — ABNORMAL LOW (ref 12.0–15.0)
LYMPHS PCT: 35 % (ref 12–46)
Lymphs Abs: 1.6 10*3/uL (ref 0.7–4.0)
MCH: 25.1 pg — ABNORMAL LOW (ref 26.0–34.0)
MCHC: 31.5 g/dL (ref 30.0–36.0)
MCV: 79.4 fL (ref 78.0–100.0)
Monocytes Absolute: 0.3 10*3/uL (ref 0.1–1.0)
Monocytes Relative: 7 % (ref 3–12)
Neutro Abs: 2.4 10*3/uL (ref 1.7–7.7)
Neutrophils Relative %: 55 % (ref 43–77)
Platelets: 274 10*3/uL (ref 150–400)
RBC: 4.47 MIL/uL (ref 3.87–5.11)
RDW: 16.3 % — AB (ref 11.5–15.5)
WBC: 4.5 10*3/uL (ref 4.0–10.5)

## 2015-04-06 LAB — HEMOGLOBIN A1C
HEMOGLOBIN A1C: 5 % (ref 4.8–5.6)
MEAN PLASMA GLUCOSE: 97 mg/dL

## 2015-04-06 MED ORDER — IBUPROFEN 200 MG PO TABS: 200.0000 mg | ORAL_TABLET | Freq: Four times a day (QID) | ORAL | Status: DC | PRN

## 2015-04-06 MED ORDER — ZOLPIDEM TARTRATE 10 MG PO TABS: 10.0000 mg | ORAL_TABLET | Freq: Every evening | ORAL | Status: DC | PRN

## 2015-04-06 MED ORDER — FLUOXETINE HCL 20 MG PO CAPS: 20.0000 mg | ORAL_CAPSULE | Freq: Every day | ORAL | Status: DC

## 2015-04-06 MED ORDER — LISINOPRIL-HYDROCHLOROTHIAZIDE 20-25 MG PO TABS: 1.0000 | ORAL_TABLET | Freq: Every day | ORAL | Status: DC

## 2015-04-06 MED ORDER — MONTELUKAST SODIUM 10 MG PO TABS: 10.0000 mg | ORAL_TABLET | Freq: Every day | ORAL | Status: DC

## 2015-04-06 MED ORDER — NICOTINE 21 MG/24HR TD PT24: 21.0000 mg | MEDICATED_PATCH | Freq: Every day | TRANSDERMAL | Status: DC

## 2015-04-06 MED ORDER — FLUTICASONE PROPIONATE 50 MCG/ACT NA SUSP: 1.0000 | Freq: Every day | NASAL | Status: DC

## 2015-04-06 MED ORDER — HYDROXYZINE HCL 25 MG PO TABS: 25.0000 mg | ORAL_TABLET | Freq: Three times a day (TID) | ORAL | Status: DC | PRN

## 2015-04-06 NOTE — Discharge Summary (Signed)
Physician Discharge Summary Note  Patient:  Rachael Paul is an 39 y.o., female MRN:  [id number] DOB:  04-20-1976 Patient phone:  [phone] (home)  Patient address:   [place] [address],  Total Time spent with patient: 45 minutes  Date of Admission:  04/04/2015 Date of Discharge: 04/06/2015  Reason for Admission:  Rachael Paul 53 yr black female present to [facility] as walk in seeking help with grief counseling and outpatient services for medication management.  Patient had a son to pass away in May 2016 unexpected of pulmonary embolism at the time of the [name] death patient stated she herself had suicidal thoughts wanting to join her son but she had other children who also need her.  States that she is no longer suicidal but still feels the grief and depression about the loss of her son and came here looking for out patient services.  "I went to [facility] but they did not accept my Insurance so they sent me here.  My primary doctor has diagnosed me with Depression and Anxiety and has taken me out of work and my disability paper forms also need to be filled out by a psychiatrist so I am need a outpatient psychiatrist so when I came here I thought that was what this was.  I was told that I would be put in observation while they worked on my outpatient services but I was admitted to the hospital.  I have other children at home who are also grieving and now there mother is not home.  When I came here I was not suicidal and was only looking for outpatient services; I need to get home to my children.  Since I have been here last night I had a dream where my son came to me and told me that he was okay; he was laughing and talking.  I guess he knew I was not suppose to be here.   Patient states that her family is very supportive and helpful.  Patient denies past suicidal attempts or self injurious behaviors. Patient denies homicidal ideation and violent history.  Patient also denies  any history of psychosis or paranoia.  Patient states that she has an appointment already set up with Hospice for family grief counseling for her and her children and that her family doctor is working with her until she can find a psychiatrist to manage her medications (started her on Prozac 20 mg daily).  States that she is tolerating the medication with out adverse effect.     Principal Problem: Major depressive disorder, single episode, severe Discharge Diagnoses: Patient Active Problem List   Diagnosis Date Noted  . Bereavement [Z63.4]   . Major depressive disorder, single episode, severe [F32.2] 04/04/2015    Musculoskeletal: Strength & Muscle Tone: within normal limits Gait & Station: normal Patient leans: N/A  Psychiatric Specialty Exam:  See Suicide Risk Assessment Physical Exam  Constitutional: She is oriented to person, place, and time.  Neck: Normal range of motion.  Respiratory: Effort normal.  Musculoskeletal: Normal range of motion.  Neurological: She is alert and oriented to person, place, and time.  Skin: Skin is warm and dry.  Psychiatric: Her speech is normal and behavior is normal. Judgment and thought content normal. Anxious: Stable. Her affect is not angry. Cognition and memory are normal. Depressed: Stable.    Review of Systems  Cardiovascular:       HX HTN  Psychiatric/Behavioral: Negative for suicidal ideas, hallucinations, memory loss and  substance abuse. Depression: Stable. The patient does not have insomnia. Nervous/anxious: Stable.   All other systems reviewed and are negative.   Blood pressure 128/73, pulse 104, temperature 98.8 F (37.1 C), temperature source Oral, resp. rate 18, height $RemoveBe'5\' 3"'zGaZCCmld$  (1.6 m), weight 84.823 kg (187 lb), last menstrual period 04/03/2015.Body mass index is 33.13 kg/(m^2).     Has this patient used any form of tobacco in the last 30 days? (Cigarettes, Smokeless Tobacco, Cigars, and/or Pipes) Yes, Patient was prescribed Nicoderm CW  21 mg 24 hr patches   Past Medical History:  Past Medical History  Diagnosis Date  . Hypertension   . Bronchitis    History reviewed. No pertinent past surgical history. Family History: History reviewed. No pertinent family history. Social History:  History  Alcohol Use  . 1.2 - 1.8 oz/week  . 2-3 Glasses of wine per week     History  Drug Use No    History   Social History  . Marital Status: Single    Spouse Name: N/A  . Number of Children: N/A  . Years of Education: N/A   Social History Main Topics  . Smoking status: Current Every Day Smoker -- 1.50 packs/day    Types: Cigarettes  . Smokeless tobacco: Not on file  . Alcohol Use: 1.2 - 1.8 oz/week    2-3 Glasses of wine per week  . Drug Use: No  . Sexual Activity: Not Currently   Other Topics Concern  . None   Social History Narrative    Past Psychiatric History: Hospitalizations: 21 yr ago for depression drug induced  Outpatient Care: In the last 2 years states that she has seen a therapist once or twice related to the death of her mother  Substance Abuse Care:  Denies Rehab or Detox  Self-Mutilation:  Denies  Suicidal Attempts:  Denies  Violent Behaviors:  Denies   Risk to Self: Suicidal Ideation: Yes-Currently Present Suicidal Intent: Yes-Currently Present Is patient at risk for suicide?: Yes Suicidal Plan?: No Specify Current Suicidal Plan: NA Access to Means: No What has been your use of drugs/alcohol within the last 12 months?: Drinks alcohol on ocassion How many times?: 0 Other Self Harm Risks: NA Triggers for Past Attempts: None known Intentional Self Injurious Behavior: None Risk to Others: Homicidal Ideation: No Thoughts of Harm to Others: No Current Homicidal Intent: No Current Homicidal Plan: No Access to Homicidal Means: No Identified Victim: NA History of harm to others?: No Assessment of Violence: None Noted Violent Behavior Description: NA Does patient have access to weapons?:  No Criminal Charges Pending?: No Does patient have a court date: No Prior Inpatient Therapy: Prior Inpatient Therapy: No Prior Therapy Dates: NA Prior Therapy Facilty/Provider(s): NA Reason for Treatment: NA Prior Outpatient Therapy: Prior Outpatient Therapy: No Prior Therapy Dates: NA Prior Therapy Facilty/Provider(s): NA Reason for Treatment: NA Does patient have an ACCT team?: No Does patient have Intensive In-House Services?  : No Does patient have [facility] services? : No Does patient have P4CC services?: No  Level of Care:  OP  Hospital Course:  Rachael Paul was admitted for Major depressive disorder, single episode, severe and crisis management.  She was treated discharged with the medications listed below under Medication List.  Medical problems were identified and treated as needed.  Home medications were restarted as appropriate.  Rachael Paul was evaluated by the treatment team for stability and plans for continued recovery upon discharge.  Rachael Paul motivation was an integral factor for  scheduling further treatment.  Employment, transportation, bed availability, health status, family support, and any pending legal issues were also considered during her hospital stay.  She was offered further treatment options upon discharge including but not limited to Residential, Intensive Outpatient, and Outpatient treatment.  Rachael Paul will follow up with the services as listed below under Follow Up Information.  Upon completion of this admission the patient was both mentally and medically stable for discharge denying suicidal/homicidal ideation, auditory/visual/tactile hallucinations, delusional thoughts and paranoia.           Consults:  psychiatry  Significant Diagnostic Studies:  labs: UDS,ETOH, CBC/Diff, CMET  Discharge Vitals:   Blood pressure 128/73, pulse 104, temperature 98.8 F (37.1 C), temperature source Oral, resp. rate 18, height $RemoveBe'5\' 3"'VlMVXedTe$  (1.6 m), weight 84.823  kg (187 lb), last menstrual period 04/03/2015. Body mass index is 33.13 kg/(m^2). Lab Results:   Results for orders placed or performed during the hospital encounter of 04/04/15 (from the past 72 hour(s))  TSH     Status: None   Collection Time: 04/05/15  6:47 AM  Result Value Ref Range   TSH 2.810 0.350 - 4.500 uIU/mL    Comment: Performed at [facility]  Hemoglobin A1c     Status: None   Collection Time: 04/05/15  6:47 AM  Result Value Ref Range   Hgb A1c MFr Bld 5.0 4.8 - 5.6 %    Comment: (NOTE)         Pre-diabetes: 5.7 - 6.4         Diabetes: >6.4         Glycemic control for adults with diabetes: <7.0    Mean Plasma Glucose 97 mg/dL    Comment: (NOTE) Performed At: [facility] [place], [place] [id number] Lindon Romp MD EX:[id number] Performed at [facility] metabolic panel     Status: Abnormal   Collection Time: 04/06/15  6:30 AM  Result Value Ref Range   Sodium 136 135 - 145 mmol/L   Potassium 3.6 3.5 - 5.1 mmol/L   Chloride 105 101 - 111 mmol/L   CO2 23 22 - 32 mmol/L   Glucose, Bld 111 (H) 65 - 99 mg/dL   BUN 12 6 - 20 mg/dL   Creatinine, Ser 0.71 0.44 - 1.00 mg/dL   Calcium 8.8 (L) 8.9 - 10.3 mg/dL   GFR calc non Af Amer >60 >60 mL/min   GFR calc Af Amer >60 >60 mL/min    Comment: (NOTE) The eGFR has been calculated using the CKD EPI equation. This calculation has not been validated in all clinical situations. eGFR's persistently <60 mL/min signify possible Chronic Kidney Disease.    Anion gap 8 5 - 15    Comment: Performed at [facility]  CBC with Differential/Platelet     Status: Abnormal   Collection Time: 04/06/15  6:30 AM  Result Value Ref Range   WBC 4.5 4.0 - 10.5 K/uL   RBC 4.47 3.87 - 5.11 MIL/uL   Hemoglobin 11.2 (L) 12.0 - 15.0 g/dL   HCT 35.5 (L) 36.0 - 46.0 %   MCV 79.4 78.0 - 100.0 fL   MCH 25.1 (L) 26.0 - 34.0 pg   MCHC 31.5 30.0 - 36.0 g/dL    RDW 16.3 (H) 11.5 - 15.5 %   Platelets 274 150 - 400 K/uL   Neutrophils Relative % 55 43 - 77 %   Neutro Abs 2.4 1.7 -  7.7 K/uL   Lymphocytes Relative 35 12 - 46 %   Lymphs Abs 1.6 0.7 - 4.0 K/uL   Monocytes Relative 7 3 - 12 %   Monocytes Absolute 0.3 0.1 - 1.0 K/uL   Eosinophils Relative 3 0 - 5 %   Eosinophils Absolute 0.1 0.0 - 0.7 K/uL   Basophils Relative 0 0 - 1 %   Basophils Absolute 0.0 0.0 - 0.1 K/uL    Comment: Performed at [facility]    Physical Findings: AIMS: Facial and Oral Movements Muscles of Facial Expression: None, normal Lips and Perioral Area: None, normal Jaw: None, normal Tongue: None, normal,Extremity Movements Upper (arms, wrists, hands, fingers): None, normal Lower (legs, knees, ankles, toes): None, normal, Trunk Movements Neck, shoulders, hips: None, normal, Overall Severity Severity of abnormal movements (highest score from questions above): None, normal Incapacitation due to abnormal movements: None, normal Patient's awareness of abnormal movements (rate only patient's report): No Awareness, Dental Status Current problems with teeth and/or dentures?: No Does patient usually wear dentures?: No  CIWA:  CIWA-Ar Total: 0 COWS:  COWS Total Score: 3   See Psychiatric Specialty Exam and Suicide Risk Assessment completed by Attending Physician prior to discharge.  Discharge destination:  Home  Is patient on multiple antipsychotic therapies at discharge:  No   Has Patient had three or more failed trials of antipsychotic monotherapy by history:  No    Recommended Plan for Multiple Antipsychotic Therapies: NA      Discharge Instructions    Activity as tolerated - No restrictions    Complete by:  As directed      Diet - low sodium heart healthy    Complete by:  As directed      Discharge instructions    Complete by:  As directed   Take all of you medications as prescribed by your mental healthcare provider.  Report any adverse  effects and reactions from your medications to your outpatient provider promptly. Do not engage in alcohol and or illegal drug use while on prescription medicines. In the event of worsening symptoms call the crisis hotline, 911, and or go to the nearest emergency department for appropriate evaluation and treatment of symptoms. Follow-up with your primary care provider for your medical issues, concerns and or health care needs.   Keep all scheduled appointments.  If you are unable to keep an appointment call to reschedule.  Let the nurse know if you will need medications before next scheduled appointment.            Medication List    TAKE these medications      Indication   FLUoxetine 20 MG capsule  Commonly known as:  PROZAC  Take 1 capsule (20 mg total) by mouth daily.   Indication:  Major Depressive Disorder     fluticasone 50 MCG/ACT nasal spray  Commonly known as:  FLONASE  Place 1 spray into both nostrils daily.   Indication:  Hayfever     hydrocortisone cream 1 %  Apply 1-2 application topically daily as needed for itching.      hydrOXYzine 25 MG tablet  Commonly known as:  ATARAX/VISTARIL  Take 1 tablet (25 mg total) by mouth 3 (three) times daily as needed for anxiety (sleep).   Indication:  anxiety and sleep     ibuprofen 200 MG tablet  Commonly known as:  ADVIL,MOTRIN  Take 1 tablet (200 mg total) by mouth every 6 (six) hours as needed for headache.  Indication:  Fever, Migraine Headache, Mild to Moderate Pain     lisinopril-hydrochlorothiazide 20-25 MG per tablet  Commonly known as:  PRINZIDE,ZESTORETIC  Take 1 tablet by mouth daily.   Indication:  High Blood Pressure     montelukast 10 MG tablet  Commonly known as:  SINGULAIR  Take 1 tablet (10 mg total) by mouth at bedtime.   Indication:  Hayfever     nicotine 21 mg/24hr patch  Commonly known as:  NICODERM CQ - dosed in mg/24 hours  Place 1 patch (21 mg total) onto the skin daily.   Indication:   Nicotine Addiction     zolpidem 10 MG tablet  Commonly known as:  AMBIEN  Take 1 tablet (10 mg total) by mouth at bedtime as needed for sleep.   Indication:  Trouble Sleeping       Follow-up Information    Follow up with [facility].   Why:  CSW will call patient at home with appointment date and time. Currently awaiting a call back with that information.   Contact information:   [address] [place], [place]   [address]  [phone]       Follow-up recommendations:  Activity:  As tolerated Diet:  Low Sodium  Comments:   Patient has been instructed to take medications as prescribed; and report adverse effects to outpatient provider.  Follow up with primary doctor for any medical issues and If symptoms recur report to nearest emergency or crisis hot line.    Total Discharge Time: 45 minutes  Signed: Earleen Newport, FNP-BC 04/06/2015, 3:42 PM  I have examined the patient and agree with the discharge plan and findings. I have also done discharge suicide assessment on this patient.

## 2015-04-06 NOTE — Progress Notes (Signed)
NSG shift assessment. 7a-7p.   D: Affect blunted, mood depressed, behavior appropriate. Pt rates her depression as 7, hopelessness is 3, and anxiety is 7 out of 10 on a scale of 0 to 10 with 10 being the worse. Her sleep was fair. She want to be discharged today because she has children and pets at home that need her.  She recognizes that they are also dealing with grief and need her. She talked about the death of her son and shared with this Probation officer that she has feelings of guilt. Last night she dreamed that her soon came to her, told her that he is ok, and walked away.  Attends groups and participates. Cooperative with staff and is getting along well with peers.   A: Discussed with pt resources available for free in the community that can help with grief: Funeral homes often offer telephone assistance at all hours to talk with people about their feelings and Hospice often has services available to help. Observed pt interacting in group and in the milieu: Support and encouragement offered. Safety maintained with observations every 15 minutes.   R:   Contracts for safety and continues to follow the treatment plan, working on learning new coping skills.

## 2015-04-06 NOTE — Progress Notes (Signed)
  [facility] Adult Case Management Discharge Plan :  Will you be returning to the same living situation after discharge:  Yes,  home with kids At discharge, do you have transportation home?: Yes,  car in parking lot Do you have the ability to pay for your medications: Yes,  no problems reported  Release of information consent forms completed and in the chart;  Patient's signature needed at discharge.  Patient to Follow up at: Follow-up Information    Follow up with [facility].   Why:  CSW will call patient at home with appointment date and time. Currently awaiting a call back with that information.   Contact information:   [place], [place]   [address]  [phone]     ALSO FOLLOW-UP WITH [facility] COUNSELING SESSION ON 04/22/15 AT 11AM, ALREADY ARRANGED.      Patient denies SI/HI: Yes,  adamantly    Safety Planning and Suicide Prevention discussed: Yes,  with pt prior to discharge     Has patient been referred to the [place]?: Patient refused referral  Lysle Dingwall 04/06/2015, 12:39 PM

## 2015-04-06 NOTE — BHH Suicide Risk Assessment (Signed)
[facility] INPATIENT:  Family/Significant Other Suicide Prevention Education  Suicide Prevention Education:  Patient Refusal for Family/Significant Other Suicide Prevention Education: The patient Rachael Paul has refused to provide written consent for family/significant other to be provided Family/Significant Other Suicide Prevention Education during admission and/or prior to discharge.  Physician notified.  At discharge, continued to refuse consent, stating that she had briefly felt 2 months ago when her son died that she needed to go take care of him, but adamant she has not felt that way since.  SPE done with patient.  Lysle Dingwall 04/06/2015, 12:38 PM

## 2015-04-06 NOTE — BHH Suicide Risk Assessment (Signed)
[facility] Discharge Suicide Risk Assessment   Demographic Factors:  Female, single Mom.   Total Time spent with patient: 30 minutes  Musculoskeletal: Strength & Muscle Tone: within normal limits Gait & Station: normal Patient leans: no lean  Psychiatric Specialty Exam: Physical Exam  Constitutional: She appears well-developed and well-nourished.  HENT:  Head: Normocephalic and atraumatic.  Skin: She is not diaphoretic.    Review of Systems  Constitutional: Negative.   Skin: Negative for rash.  Neurological: Negative for tremors and headaches.  Psychiatric/Behavioral: Negative for suicidal ideas. The patient does not have insomnia.     Blood pressure 128/73, pulse 104, temperature 98.8 F (37.1 C), temperature source Oral, resp. rate 18, height 5\' 3"  (1.6 m), weight 84.823 kg (187 lb), last menstrual period 04/03/2015.Body mass index is 33.13 kg/(m^2).  General Appearance: Casual  Eye Contact::  Fair  Speech:  Slow409  Volume:  Normal  Mood:  Dysphoric  Affect:  Congruent  Thought Process:  Coherent  Orientation:  Full (Time, Place, and Person)  Thought Content:  Rumination  Suicidal Thoughts:  No  Homicidal Thoughts:  No  Memory:  Immediate;   Fair Recent;   Fair  Judgement:  Fair  Insight:  Fair  Psychomotor Activity:  Normal  Concentration:  Fair  Recall:  [employer] of Knowledge:Fair  Language: Fair  Akathisia:  Negative  Handed:  Right  AIMS (if indicated):     Assets:  Communication Skills Desire for Improvement Leisure Time Physical Health Transportation  Sleep:  Number of Hours: 5.5  Cognition: WNL  ADL's:  Intact      Has this patient used any form of tobacco in the last 30 days? (Cigarettes, Smokeless Tobacco, Cigars, and/or Pipes) N/A  Mental Status Per Nursing Assessment::   On Admission:  NA  Current Mental Status by Physician: see MSE  Loss Factors: lost her 35 years old son in may 2016  Historical Factors: depression   Risk Reduction  Factors:   Sense of responsibility to family, Positive therapeutic relationship and Positive coping skills or problem solving skills  Continued Clinical Symptoms:  Dysthymia Previous Psychiatric Diagnoses and Treatments  Cognitive Features That Contribute To Risk:  None    Suicide Risk:  Minimal: No identifiable suicidal ideation.  Patients presenting with no risk factors but with morbid ruminations; may be classified as minimal risk based on the severity of the depressive symptoms  Principal Problem: Major depressive disorder, single episode, severe Discharge Diagnoses:  Patient Active Problem List   Diagnosis Date Noted  . Major depressive disorder, single episode, severe [F32.2] 04/04/2015    Follow-up Information    Follow up with [facility].   Why:  CSW will call patient at home with appointment   Contact information:   [address] [place], [place]   [address]  [phone]       Plan Of Care/Follow-up recommendations:  Activity:  as tolerated Diet:  regular  Continue Prozac Wants to follow outpatient . Continues to deny suicidal toughts or plan. Wants to have referral sources and follow up outpatient. Remains cooperative   Is patient on multiple antipsychotic therapies at discharge:  No   Has Patient had three or more failed trials of antipsychotic monotherapy by history:  No  Recommended Plan for Multiple Antipsychotic Therapies: NA    Davon Abdelaziz 04/06/2015, 10:58 AM

## 2015-04-06 NOTE — [facility] Group Notes (Signed)
[facility] Group Notes: (Clinical Social Work)   04/06/2015      Type of Therapy:  Group Therapy   Participation Level:  Did Not Attend - Discharged   Selmer Dominion, LCSW 04/06/2015, 4:34 PM

## 2015-04-06 NOTE — Progress Notes (Signed)
D: Patient verbalizes readiness for discharge: Denies SI/HI, is not psychotic or delusional.   A: Discharge instructions read and discussed with patient. All belongings returned to pt.   R: Pt verbalized understanding of discharge instructions. Signed for return of belongings.   A: Escorted to the lobby.

## 2015-04-08 NOTE — Clinical Social Work Note (Signed)
CSW spoke with Rachael Paul at [facility].  She scheduled patient to be seen by Yancey Flemings on Thursday, April 11, 2015 at 10:30 AM.  Rachael Paul called to notify patient and to verify insurance with [facility].

## 2018-10-12 DIAGNOSIS — G459 Transient cerebral ischemic attack, unspecified: Secondary | ICD-10-CM

## 2018-10-12 HISTORY — DX: Transient cerebral ischemic attack, unspecified: G45.9

## 2018-10-25 ENCOUNTER — Emergency Department (HOSPITAL_COMMUNITY): Payer: 59

## 2018-10-25 ENCOUNTER — Encounter (HOSPITAL_COMMUNITY): Payer: Self-pay | Admitting: Emergency Medicine

## 2018-10-25 ENCOUNTER — Emergency Department (HOSPITAL_COMMUNITY)
Admission: EM | Admit: 2018-10-25 | Discharge: 2018-10-26 | Disposition: A | Discharge status: Home / Self Care | Payer: 59 | Attending: Emergency Medicine | Admitting: Emergency Medicine

## 2018-10-25 ENCOUNTER — Other Ambulatory Visit: Payer: Self-pay

## 2018-10-25 DIAGNOSIS — R51 Headache: Secondary | ICD-10-CM | POA: Diagnosis not present

## 2018-10-25 DIAGNOSIS — I10 Essential (primary) hypertension: Secondary | ICD-10-CM

## 2018-10-25 DIAGNOSIS — F17210 Nicotine dependence, cigarettes, uncomplicated: Secondary | ICD-10-CM | POA: Diagnosis not present

## 2018-10-25 DIAGNOSIS — R202 Paresthesia of skin: Secondary | ICD-10-CM | POA: Diagnosis not present

## 2018-10-25 DIAGNOSIS — Z79899 Other long term (current) drug therapy: Secondary | ICD-10-CM | POA: Diagnosis not present

## 2018-10-25 DIAGNOSIS — R200 Anesthesia of skin: Secondary | ICD-10-CM | POA: Diagnosis present

## 2018-10-25 DIAGNOSIS — R600 Localized edema: Secondary | ICD-10-CM | POA: Insufficient documentation

## 2018-10-25 HISTORY — DX: Acute embolism and thrombosis of unspecified deep veins of unspecified lower extremity: I82.409

## 2018-10-25 LAB — BASIC METABOLIC PANEL
Anion gap: 7 (ref 5–15)
BUN: 10 mg/dL (ref 6–20)
CALCIUM: 8.7 mg/dL — AB (ref 8.9–10.3)
CO2: 22 mmol/L (ref 22–32)
Chloride: 107 mmol/L (ref 98–111)
Creatinine, Ser: 0.72 mg/dL (ref 0.44–1.00)
GFR calc Af Amer: >60 mL/min (ref >60)
Glucose, Bld: 113 mg/dL — ABNORMAL HIGH (ref 70–99)
Potassium: 3.7 mmol/L (ref 3.5–5.1)
SODIUM: 136 mmol/L (ref 135–145)

## 2018-10-25 LAB — CBC WITH DIFFERENTIAL/PLATELET
Abs Immature Granulocytes: 0.01 10*3/uL (ref 0.00–0.07)
Basophils Absolute: 0 10*3/uL (ref 0.0–0.1)
Basophils Relative: 0 %
EOS ABS: 0.1 10*3/uL (ref 0.0–0.5)
EOS PCT: 1 %
HCT: 41.3 % (ref 36.0–46.0)
HEMOGLOBIN: 12.8 g/dL (ref 12.0–15.0)
Immature Granulocytes: 0 %
Lymphocytes Relative: 20 %
Lymphs Abs: 0.9 10*3/uL (ref 0.7–4.0)
MCH: 27.6 pg (ref 26.0–34.0)
MCHC: 31 g/dL (ref 30.0–36.0)
MCV: 89 fL (ref 80.0–100.0)
MONO ABS: 0.4 10*3/uL (ref 0.1–1.0)
Monocytes Relative: 9 %
Neutro Abs: 3.2 10*3/uL (ref 1.7–7.7)
Neutrophils Relative %: 70 %
Platelets: 234 10*3/uL (ref 150–400)
RBC: 4.64 MIL/uL (ref 3.87–5.11)
RDW: 16 % — AB (ref 11.5–15.5)
WBC: 4.5 10*3/uL (ref 4.0–10.5)
nRBC: 0 % (ref 0.0–0.2)

## 2018-10-25 LAB — BRAIN NATRIURETIC PEPTIDE: B Natriuretic Peptide: 114.9 pg/mL — ABNORMAL HIGH (ref 0.0–100.0)

## 2018-10-25 LAB — I-STAT TROPONIN, ED: Troponin i, poc: 0.01 ng/mL (ref 0.00–0.08)

## 2018-10-25 MED ORDER — LISINOPRIL 20 MG PO TABS
20.0000 mg | ORAL_TABLET | Freq: Once | ORAL | Status: AC
  Administered 2018-10-25: 20 mg via ORAL
  Filled 2018-10-25: qty 1

## 2018-10-25 MED ORDER — HYDROCHLOROTHIAZIDE 25 MG PO TABS
25.0000 mg | ORAL_TABLET | Freq: Every day | ORAL | Status: DC
  Administered 2018-10-25: 25 mg via ORAL
  Filled 2018-10-25: qty 1

## 2018-10-25 MED ORDER — CLONIDINE HCL 0.1 MG PO TABS
0.1000 mg | ORAL_TABLET | Freq: Once | ORAL | Status: AC
  Administered 2018-10-25: 0.1 mg via ORAL
  Filled 2018-10-25: qty 1

## 2018-10-25 MED ORDER — GADOBUTROL 1 MMOL/ML IV SOLN
7.0000 mL | Freq: Once | INTRAVENOUS | Status: AC | PRN
  Administered 2018-10-25: 7 mL via INTRAVENOUS

## 2018-10-25 MED ORDER — LABETALOL HCL 5 MG/ML IV SOLN
5.0000 mg | Freq: Once | INTRAVENOUS | Status: AC
  Administered 2018-10-25: 5 mg via INTRAVENOUS
  Filled 2018-10-25: qty 4

## 2018-10-25 NOTE — ED Notes (Signed)
Pt back from MRI 

## 2018-10-25 NOTE — ED Notes (Signed)
Patient transported to MRI 

## 2018-10-25 NOTE — ED Provider Notes (Signed)
[facility] EMERGENCY DEPARTMENT Provider Note   CSN: [id number] Arrival date & time: 10/25/18  1043     History   Chief Complaint No chief complaint on file.   HPI Rachael Paul is a 43 y.o. female who presents with paresthesias. PMH significant for HTN, hx of DVT of the left leg, depression. She states that she had a sudden onset of numbness/tingling in the R hand around 10am while at work. Then she noticed the right side of her face was numb as well and her right thigh and knee. She told a coworker and decided to call EMS because she's never had these symptoms before. Her BP was noted to be markedly elevated. She has been off BP meds for several weeks while waiting to get in with her doctor for a refill. She reports associated left sided dull headache which has been going on "awhile", dizziness which has resolved, and some very mild chest pain which she has difficulty describing. She drove to [place] recently and noticed on the way back she's had some lower extremity edema. She denies LOC, severe headache, neck pain, SOB, vision changes, unilateral weakness.   HPI  Past Medical History:  Diagnosis Date  . Bronchitis   . DVT (deep venous thrombosis) (HCC)    Left Leg   . Hypertension     Patient Active Problem List   Diagnosis Date Noted  . Bereavement   . Major depressive disorder, single episode, severe ([place]) 04/04/2015    History reviewed. No pertinent surgical history.   OB History   No obstetric history on file.      Home Medications    Prior to Admission medications   Medication Sig Start Date End Date Taking? Authorizing Provider  FLUoxetine (PROZAC) 20 MG capsule Take 1 capsule (20 mg total) by mouth daily. 04/06/15   Rankin, Shuvon B, NP  fluticasone (FLONASE) 50 MCG/ACT nasal spray Place 1 spray into both nostrils daily. 04/06/15   Rankin, Shuvon B, NP  hydrocortisone cream 1 % Apply 1-2 application topically daily as needed for itching.     [provider]  hydrOXYzine (ATARAX/VISTARIL) 25 MG tablet Take 1 tablet (25 mg total) by mouth 3 (three) times daily as needed for anxiety (sleep). 04/06/15   Rankin, Shuvon B, NP  ibuprofen (ADVIL,MOTRIN) 200 MG tablet Take 1 tablet (200 mg total) by mouth every 6 (six) hours as needed for headache. 04/06/15   Rankin, Shuvon B, NP  lisinopril-hydrochlorothiazide (PRINZIDE,ZESTORETIC) 20-25 MG per tablet Take 1 tablet by mouth daily. 04/06/15   Rankin, Shuvon B, NP  montelukast (SINGULAIR) 10 MG tablet Take 1 tablet (10 mg total) by mouth at bedtime. 04/06/15   Rankin, Shuvon B, NP  nicotine (NICODERM CQ - DOSED IN MG/24 HOURS) 21 mg/24hr patch Place 1 patch (21 mg total) onto the skin daily. 04/06/15   Rankin, Shuvon B, NP  zolpidem (AMBIEN) 10 MG tablet Take 1 tablet (10 mg total) by mouth at bedtime as needed for sleep. 04/06/15   Rankin, Mercy Moore, NP    Family History History reviewed. No pertinent family history.  Social History Social History   Tobacco Use  . Smoking status: Current Every Day Smoker    Packs/day: 0.50    Types: Cigarettes  Substance Use Topics  . Alcohol use: Yes    Alcohol/week: 2.0 - 3.0 standard drinks    Types: 2 - 3 Glasses of wine per week  . Drug use: No     Allergies  Patient has no known allergies.   Review of Systems Review of Systems  Constitutional: Negative for fever.  Eyes: Negative for visual disturbance.  Respiratory: Negative for shortness of breath.   Cardiovascular: Positive for chest pain and leg swelling.  Gastrointestinal: Negative for abdominal pain.  Neurological: Positive for dizziness, numbness and headaches. Negative for syncope, speech difficulty and weakness.  All other systems reviewed and are negative.    Physical Exam Updated Vital Signs BP (!) 194/97   Pulse 81   Temp 98.4 F (36.9 C) (Oral)   Resp (!) 25   Ht 5\' 5"  (1.651 m)   Wt 77.1 kg   LMP 10/25/2018   SpO2 97%   BMI 28.29 kg/m   Physical  Exam Vitals signs and nursing note reviewed.  Constitutional:      General: She is not in acute distress.    Appearance: Normal appearance. She is well-developed.     Comments: Calm and cooperative. NAD  HENT:     Head: Normocephalic and atraumatic.  Eyes:     General: No scleral icterus.       Right eye: No discharge.        Left eye: No discharge.     Conjunctiva/sclera: Conjunctivae normal.     Pupils: Pupils are equal, round, and reactive to light.  Neck:     Musculoskeletal: Normal range of motion.  Cardiovascular:     Rate and Rhythm: Normal rate and regular rhythm.  Pulmonary:     Effort: Pulmonary effort is normal. No respiratory distress.     Breath sounds: Normal breath sounds.  Abdominal:     General: There is no distension.     Palpations: Abdomen is soft.     Tenderness: There is no abdominal tenderness.  Skin:    General: Skin is warm and dry.  Neurological:     Mental Status: She is alert and oriented to person, place, and time.     Comments: Mental Status:  Alert, oriented, thought content appropriate, able to give a coherent history. Speech fluent without evidence of aphasia. Able to follow 2 step commands without difficulty.  Cranial Nerves:  II:  Peripheral visual fields grossly normal, pupils equal, round, reactive to light III,IV, VI: ptosis not present, extra-ocular motions intact bilaterally  V,VII: smile symmetric, subjective numbness of the right side of the face, particularly around the right cheek X: uvula elevates symmetrically  XI: bilateral shoulder shrug symmetric and strong XII: midline tongue extension without fassiculations Motor:  Normal tone. 5/5 in upper and lower extremities bilaterally including strong and equal grip strength and dorsiflexion/plantar flexion Sensory: Subjective decreased sensation of the right upper and right lower extremities without any obvious distribution  Cerebellar: normal finger-to-nose with bilateral upper  extremities Gait: not tested CV: distal pulses palpable throughout    Psychiatric:        Behavior: Behavior normal.      ED Treatments / Results  Labs (all labs ordered are listed, but only abnormal results are displayed) Labs Reviewed  BASIC METABOLIC PANEL - Abnormal; Notable for the following components:      Result Value   Glucose, Bld 113 (*)    Calcium 8.7 (*)    All other components within normal limits  CBC WITH DIFFERENTIAL/PLATELET - Abnormal; Notable for the following components:   RDW 16.0 (*)    All other components within normal limits  BRAIN NATRIURETIC PEPTIDE  I-STAT TROPONIN, ED    EKG EKG Interpretation  Date/Time:  Tuesday October 25 2018 10:50:55 EST Ventricular Rate:  90 PR Interval:    QRS Duration: 99 QT Interval:  354 QTC Calculation: 434 R Axis:   63 Text Interpretation:  Sinus rhythm Repol abnrm suggests ischemia, inferior leads Borderline ST elevation, lateral leads No significant change since last tracing Confirmed by Gareth Morgan [phone]) on 10/25/2018 11:46:20 AM   Radiology No results found.  Procedures Procedures (including critical care time)  Medications Ordered in ED Medications  hydrochlorothiazide (HYDRODIURIL) tablet 25 mg (25 mg Oral Given 10/25/18 1150)  cloNIDine (CATAPRES) tablet 0.1 mg (0.1 mg Oral Given 10/25/18 1149)  lisinopril (PRINIVIL,ZESTRIL) tablet 20 mg (20 mg Oral Given 10/25/18 1150)     Initial Impression / Assessment and Plan / ED Course  I have reviewed the triage vital signs and the nursing notes.  Pertinent labs & imaging results that were available during my care of the patient were reviewed by me and considered in my medical decision making (see chart for details).  Clinical Course as of Oct 25 1545  Tue Oct 25, 2018  1536 Pt in MRI. Unable to reassess at this time.    [AM]    Clinical Course User Index [AM] Albesa Seen, PA-C    43 year old female presents with acute onset of right  facial, arm, and leg paresthesias. She is markedly hypertensive on arrival with SBP 190-200s. Her neurologic exam is normal other than subjective complaints. Will give her her home meds and obtain labs, EKG, CXR, trop, CT head.  CBC is normal. BMP is normal. BNP is 114. I-stat trop is normal. CXR is negative. CT head is negative. BP is improved to 140/90s. She states symptoms are better but not resolved. MRI brain ordered. Will transfer care to [facility]. Pt can likely go home if MRI is negative and symptoms improve.   Final Clinical Impressions(s) / ED Diagnoses   Final diagnoses:  Paresthesia  Hypertension, unspecified type    ED Discharge Orders    None       Recardo Evangelist, PA-C 10/25/18 1550    Gareth Morgan, MD 10/27/18 2357

## 2018-10-25 NOTE — ED Provider Notes (Signed)
Physical Exam  BP 138/82 (BP Location: Right Arm)   Pulse 77   Temp 98.4 F (36.9 C) (Oral)   Resp 17   Ht 5\' 5"  (1.651 m)   Wt 77.1 kg   LMP 10/25/2018   SpO2 100%   BMI 28.29 kg/m   Assumed care from Janetta Hora, PA-C at 3:25 PM. Please see her note for full H & P. Briefly, the patient is a 43 y.o. female with PMHx of  has a past medical history of Bronchitis, DVT (deep venous thrombosis) ([place]), and Hypertension. here with right facial, arm and leg numbness.  Symptoms began around 10 AM.  Patient presented approximately 1 hour later.  Her symptoms are improving.  She had no weakness per previous provider's evaluation.  Differential diagnosis including TIA, CVA, hypertensive urgency/emergency.  Patient was given home medications, and her blood pressure is now 138/82.  Labs Reviewed  BASIC METABOLIC PANEL - Abnormal; Notable for the following components:      Result Value   Glucose, Bld 113 (*)    Calcium 8.7 (*)    All other components within normal limits  CBC WITH DIFFERENTIAL/PLATELET - Abnormal; Notable for the following components:   RDW 16.0 (*)    All other components within normal limits  BRAIN NATRIURETIC PEPTIDE - Abnormal; Notable for the following components:   B Natriuretic Peptide 114.9 (*)    All other components within normal limits  I-STAT TROPONIN, ED    Course of Care:   Physical Exam Vitals signs and nursing note reviewed.  Constitutional:      General: She is not in acute distress.    Appearance: She is well-developed. She is not diaphoretic.     Comments: Sitting comfortably in bed.  HENT:     Head: Normocephalic and atraumatic.  Eyes:     General:        Right eye: No discharge.        Left eye: No discharge.     Conjunctiva/sclera: Conjunctivae normal.     Comments: EOMs normal to gross examination.  Neck:     Musculoskeletal: Normal range of motion.  Cardiovascular:     Rate and Rhythm: Normal rate and regular rhythm.     Comments:  Intact, 2+ radial pulse. Abdominal:     General: There is no distension.  Musculoskeletal: Normal range of motion.  Skin:    General: Skin is warm and dry.  Neurological:     Mental Status: She is alert.     Comments: Mental Status:  Alert, oriented, thought content appropriate, able to give a coherent history. Speech fluent without evidence of aphasia. Able to follow 2 step commands without difficulty.  Cranial Nerves:  II:  Peripheral visual fields grossly normal, pupils equal, round, reactive to light III,IV, VI: ptosis not present, extra-ocular motions intact bilaterally  V,VII: smile symmetric, facial light touch decreased in right maxillary region.  VIII: hearing grossly normal to voice  X: uvula elevates symmetrically  XI: bilateral shoulder shrug symmetric and strong XII: midline tongue extension without fassiculations Motor:  Normal tone. 5/5 in upper and lower extremities bilaterally including strong and equal grip strength and dorsiflexion/plantar flexion Sensory: Symmetric decreased sensation in the distal tip of the 4th and 5th digits of the right hand and just distal to the right knee.  Deep Tendon Reflexes: 2+ and symmetric in the biceps and patella.  Cerebellar: normal finger-to-nose with bilateral upper extremities Gait: normal gait and balance Stance: No pronator drift  and good coordination, strength, and position sense with tapping of bilateral arms (performed in sitting position). CV: distal pulses palpable throughout    Psychiatric:        Behavior: Behavior normal.        Thought Content: Thought content normal.        Judgment: Judgment normal.     ED Course/Procedures   Clinical Course as of Oct 26 116  Tue Oct 25, 2018  1536 Pt in MRI. Unable to reassess at this time.    [AM]  1716 Reassessed patient.  Patient reporting she has improved symptoms but is still experiencing some numbness of her right face, most pronounced around her orbit, right hand  most pronounced around her fourth and fifth digits, and right lower extremity most pronounced around her right knee.  Neurology is paged to discuss the findings on the MRI.   [AM]  1740 BP: 138/82 [AM]  Wed Oct 26, 2018  0004 Spoke with Dr. Rory Percy of neurology who states that pt does not have any changes suggestive of MS. Pt can be restarted on home BP meds. Appreciate his involvement.    [AM]  0116 Occasion risk-benefit conversation with patient regarding Lovenox administration regarding her DVT study tomorrow morning.  Stated to patient that I would be comfortable sending her home without Lovenox shot given low pretest probability for DVT, however also given that she has had a DVT before in the setting of pregnancy, I do think that Lovenox shot is reasonable.  Patient elected not to do Lovenox shot, she stated that she did not want to wait for pharmacy preparation.  I discussed with the patient that I felt this was reasonable and still within the realm of medical advice.   [AM]    Clinical Course User Index [AM] Albesa Seen, PA-C    Procedures  MDM   Upon return from MRI, patient was evaluated and complete neurologic exam was completed.  Patient continues to have subjective decreased sensation particularly of the maxillary region of the right side of the face, ulnar aspect of the right arm, and just distal to the right knee.  She is also reporting feeling that her right lower extremity feels "different", and it feels that it is "weak" when she is walking.  She ambulated with slightly antalgic gait, but no evidence of foot drop or true muscular weakness.  She is also having some popliteal pain.  She reports that she has a history of DVT and recent had a long car ride to [place] within the last 2 weeks.  Case was discussed with Dr. Cheral Marker of neurology who states that patient's read on her MRI is nonspecific, and postcontrast MRI could be obtained, however with these findings on MRI alone,  she could have outpatient follow-up.  Differential still includes hypertensive urgency versus other acute neurologic process.  There is no evidence of stroke on MRI.  7:12 PM Case discussed with Dr. Deno Etienne, and per review of case, feel that hypertensive urgency is still a concern and patient should be admitted for further work-up and management.  Recommends 5 mg Labetalol dose.   Patient case discussed multiple times as above with Dr. Rory Percy.  Recommends placing patient on her home medications.  Patient has had 2- MRIs for CVA and definitive MRI with contrast with no evidence of demyelination.  Patient given follow-up for neurology and ambulatory referral placed.  Recommended follow-up with patient's primary care provider in 1 week for blood pressure recheck.  Return precautions were given for any signs of progression of neurologic symptoms.  Patient is in understanding and agrees with plan of care.  This is a shared visit with Dr. Deno Etienne. Patient was independently evaluated by this attending physician. Attending physician consulted in evaluation and discharge management.    Albesa Seen, PA-C 10/26/18 [place], [place], DO 10/27/18 [phone]

## 2018-10-25 NOTE — ED Triage Notes (Signed)
Pt BIB EMS for c/o right sided facial, arm and leg tingling.  Pt states sudden onset this AM.  Pt denies any pain currently.  Pt has a history of HTN but states she has not been taking her meds for the past 3 weeks.

## 2018-10-25 NOTE — ED Notes (Addendum)
Pt returned from CT °

## 2018-10-26 ENCOUNTER — Ambulatory Visit (HOSPITAL_BASED_OUTPATIENT_CLINIC_OR_DEPARTMENT_OTHER)
Admission: RE | Admit: 2018-10-26 | Discharge: 2018-10-26 | Disposition: A | Discharge status: Home / Self Care | Payer: 59 | Source: Ambulatory Visit | Attending: Physician Assistant | Admitting: Physician Assistant

## 2018-10-26 DIAGNOSIS — M25569 Pain in unspecified knee: Secondary | ICD-10-CM | POA: Diagnosis not present

## 2018-10-26 MED ORDER — ASPIRIN 81 MG PO CHEW: 81.0000 mg | CHEWABLE_TABLET | Freq: Every day | ORAL | 0 refills | Status: AC

## 2018-10-26 MED ORDER — HYDROCHLOROTHIAZIDE 25 MG PO TABS: 25.0000 mg | ORAL_TABLET | Freq: Every day | ORAL | 0 refills | Status: DC

## 2018-10-26 MED ORDER — CLONIDINE HCL 0.1 MG PO TABS: 0.1000 mg | ORAL_TABLET | Freq: Two times a day (BID) | ORAL | 0 refills | Status: DC

## 2018-10-26 NOTE — Discharge Instructions (Addendum)
Please see the information and instructions below regarding your visit.  Your diagnoses today include:  1. Paresthesia   2. Hypertension, unspecified type    IMPORTANT PATIENT INSTRUCTIONS:  You have been scheduled for an Outpatient Vascular Study at [facility].    If tomorrow is a Saturday, Sunday or holiday, please go to the [facility] [place] Emergency Department Registration Desk at 8 am tomorrow morning and tell them you are there for a vascular study.  If tomorrow is a weekday (Monday-Friday), please go to [facility] Admitting Department at 8 am and tell them  you are there for a vascular study.  Tests performed today include: See side panel of your discharge paperwork for testing performed today. Vital signs are listed at the bottom of these instructions.   Your MRIs show no evidence of multiple sclerosis or stroke.  Medications prescribed:    Take any prescribed medications only as prescribed, and any over the counter medications only as directed on the packaging.  Please resume your blood pressure medications as we discussed.  I am also starting on 81 mg of aspirin daily.  Home care instructions:  Please follow any educational materials contained in this packet.   Follow-up instructions: Please follow-up with your primary care provider in one week for further evaluation of your symptoms if they are not completely improved.   Please follow up with neurology as soon as possible.  I placed a consult to them so they should be reaching out to you.  Return instructions:  Please return to the Emergency Department if you experience worsening symptoms.  Please return to the emergency department immediately for any sudden severe headaches, vision changes, progression of your neurologic symptoms or any new or worsening symptoms. Please return if you have any other emergent concerns.  Additional Information:   Your vital signs today were: BP (!) 146/73 (BP Location: Left Arm)     Pulse 74    Temp 98.4 F (36.9 C) (Oral)    Resp 16    Ht 5\' 5"  (1.651 m)    Wt 77.1 kg    LMP 10/25/2018    SpO2 99%    BMI 28.29 kg/m  If your blood pressure (BP) was elevated on multiple readings during this visit above 130 for the top number or above 80 for the bottom number, please have this repeated by your primary care provider within one month. --------------  Thank you for allowing [place] to participate in your care today.

## 2018-10-27 ENCOUNTER — Encounter: Payer: Self-pay | Admitting: Neurology

## 2018-10-27 ENCOUNTER — Ambulatory Visit (INDEPENDENT_AMBULATORY_CARE_PROVIDER_SITE_OTHER): Payer: 59 | Admitting: Neurology

## 2018-10-27 VITALS — BP 172/101 | HR 87 | Ht 65.0 in | Wt 168.2 lb

## 2018-10-27 DIAGNOSIS — D649 Anemia, unspecified: Secondary | ICD-10-CM

## 2018-10-27 DIAGNOSIS — R202 Paresthesia of skin: Secondary | ICD-10-CM

## 2018-10-27 NOTE — Progress Notes (Signed)
PATIENT: Rachael Paul DOB: [birth year]  Chief Complaint  Patient presents with  . Numbness    Reports being seen on 10/25/2018 in ED for right-sided facial, arm and leg numbness.  Her symptoms are still present but mild.  Says she was instructed to start aspirin 81mg , one tablet daily, until she was seen here for further evaluation. She would like to review her CT head and MRI brain scans.  Marland Kitchen PCP    [facility] - she has not been there in 2+ years (referred from ED)     Rachael Paul is a 43 year old female, seen in request by her primary care physician from [place], [facility] for evaluation of right side paresthesia initial evaluation was on October 27, 2018.  I have reviewed and summarized the referring note from the referring physician.  She had a history of hypertension, on October 25, 2018, after having breakfast, working as a Restaurant manager, fast food on the phone, she had sudden onset right hand numbness quickly spreading to involving her right arm, right face, right flank, right lower extremity, she denies weakness, was sent to the emergency room for evaluation, blood pressure was elevated 194/97,  I personally reviewed MRI of the brain, no acute abnormality, scattered supratentorium small vessel disease,  She was started on aspirin 81 mg daily since then, now she continue has right side paresthesia involving her face, arm, hand, trunk, right leg since its onset, blood pressure remains elevated 172/101 today  Laboratory evaluations on October 25, 2018, negative troponin, BNP was mildly elevated 114, normal BMP, CBC  REVIEW OF SYSTEMS: Full 14 system review of systems performed and notable only for headache All other review of systems were negative.  ALLERGIES: No Known Allergies  HOME MEDICATIONS: Current Outpatient Medications  Medication Sig Dispense Refill  . aspirin 81 MG chewable tablet Chew 1 tablet (81 mg total) by mouth  daily. 30 tablet 0  . cloNIDine (CATAPRES) 0.1 MG tablet Take 1 tablet (0.1 mg total) by mouth 2 (two) times daily for 14 days. 28 tablet 0  . hydrochlorothiazide (HYDRODIURIL) 25 MG tablet Take 1 tablet (25 mg total) by mouth daily. 14 tablet 0   No current facility-administered medications for this visit.     PAST MEDICAL HISTORY: Past Medical History:  Diagnosis Date  . Anxiety and depression   . Asthma    childhood  . Bronchitis   . DVT (deep venous thrombosis) (HCC)    Left Leg   . Hypertension   . Numbness     PAST SURGICAL HISTORY: Past Surgical History:  Procedure Laterality Date  . TUBAL LIGATION      FAMILY HISTORY: Family History  Problem Relation Age of Onset  . Other Mother        drug related death  . Diabetes Mother   . Other Father        unsure of history  . Hypertension Maternal Grandmother   . Diabetes Maternal Grandfather   . Dementia Maternal Grandfather     SOCIAL HISTORY: Social History   Socioeconomic History  . Marital status: Single    Spouse name: Not on file  . Number of children: 4  . Years of education: 49  . Highest education level: High school graduate  Occupational History  . Occupation: Therapist, art  Social Needs  . Financial resource strain: Not on file  . Food insecurity:    Worry: Not on file    Inability: Not  on file  . Transportation needs:    Medical: Not on file    Non-medical: Not on file  Tobacco Use  . Smoking status: Current Every Day Smoker    Packs/day: 1.00    Types: Cigarettes  . Smokeless tobacco: Never Used  Substance and Sexual Activity  . Alcohol use: Yes    Comment: 2 glasses of wine daily  . Drug use: No  . Sexual activity: Not Currently  Lifestyle  . Physical activity:    Days per week: Not on file    Minutes per session: Not on file  . Stress: Not on file  Relationships  . Social connections:    Talks on phone: Not on file    Gets together: Not on file    Attends religious  service: Not on file    Active member of club or organization: Not on file    Attends meetings of clubs or organizations: Not on file    Relationship status: Not on file  . Intimate partner violence:    Fear of current or ex partner: Not on file    Emotionally abused: Not on file    Physically abused: Not on file    Forced sexual activity: Not on file  Other Topics Concern  . Not on file  Social History Narrative   Lives at home with son.   Right-handed.   Occasional caffeine use.     PHYSICAL EXAM   Vitals:   10/27/18 1441  BP: (!) 172/101  Pulse: 87  Weight: 168 lb 4 oz (76.3 kg)  Height: 5\' 5"  (1.651 m)    Not recorded      Body mass index is 28 kg/m.  PHYSICAL EXAMNIATION:  Gen: NAD, conversant, well nourised, obese, well groomed                     Cardiovascular: Regular rate rhythm, no peripheral edema, warm, nontender. Eyes: Conjunctivae clear without exudates or hemorrhage Neck: Supple, no carotid bruits. Pulmonary: Clear to auscultation bilaterally   NEUROLOGICAL EXAM:  MENTAL STATUS: Speech:    Speech is normal; fluent and spontaneous with normal comprehension.  Cognition:     Orientation to time, place and person     Normal recent and remote memory     Normal Attention span and concentration     Normal Language, naming, repeating,spontaneous speech     Fund of knowledge   CRANIAL NERVES: CN II: Visual fields are full to confrontation. Fundoscopic exam is normal with sharp discs and no vascular changes. Pupils are round equal and briskly reactive to light. CN III, IV, VI: extraocular movement are normal. No ptosis. CN V: Facial sensation is intact to pinprick in all 3 divisions bilaterally. Corneal responses are intact.  CN VII: Face is symmetric with normal eye closure and smile. CN VIII: Hearing is normal to rubbing fingers CN IX, X: Palate elevates symmetrically. Phonation is normal. CN XI: Head turning and shoulder shrug are intact CN XII:  Tongue is midline with normal movements and no atrophy.  MOTOR: There is no pronator drift of out-stretched arms. Muscle bulk and tone are normal. Muscle strength is normal.  REFLEXES: Reflexes are 2+ and symmetric at the biceps, triceps, knees, and ankles. Plantar responses are flexor.  SENSORY: Intact to light touch, pinprick, positional sensation and vibratory sensation are intact in fingers and toes.  COORDINATION: Rapid alternating movements and fine finger movements are intact. There is no dysmetria on finger-to-nose and heel-knee-shin.  GAIT/STANCE: Posture is normal. Gait is steady with normal steps, base, arm swing, and turning. Heel and toe walking are normal. Tandem gait is normal.  Romberg is absent.   DIAGNOSTIC DATA (LABS, IMAGING, TESTING) - I reviewed patient records, labs, notes, testing and imaging myself where available.   ASSESSMENT AND PLAN  Dhruti Ghuman is a 43 y.o. female   Right side paresthesia  Even though MRI of the brain did not show acute stroke, it could not rule out the possibility of a small vessel acute stroke, likely localized to left thalamus  She has vascular risk factor of poorly controlled hypertension  MRI of the brain and neck  Echocardiogram  Laboratory evaluation including hypercoagulable work-up  Continue aspirin daily, increase water intake, optimize blood pressure control with goal less than 130/80   Marcial Pacas, M.D. Ph.D.  [facility] [address], [place], [place] [address] Ph: [phone] Fax: [phone]  CC: [facility]

## 2018-10-31 ENCOUNTER — Telehealth: Payer: Self-pay | Admitting: Neurology

## 2018-10-31 ENCOUNTER — Telehealth: Payer: Self-pay | Admitting: *Deleted

## 2018-10-31 DIAGNOSIS — Z0289 Encounter for other administrative examinations: Secondary | ICD-10-CM

## 2018-10-31 NOTE — Telephone Encounter (Signed)
We received FMLA paperwork from [employer].  The patient is a Radiation protection practitioner for them.  We need to clarify her needs.  I have left a message requesting her to return my call.

## 2018-10-31 NOTE — Telephone Encounter (Signed)
lvm for pt to call back about scheduling mri  [employer] Auth: [place] via [facility]

## 2018-10-31 NOTE — Telephone Encounter (Signed)
Patient returned my call she is scheduled for 11/02/18 at [facility].

## 2018-11-01 NOTE — Telephone Encounter (Signed)
I have spoken with the patient.  She has been allowed time off to complete her MRI scans, ECHO and follow up appts.  The paperwork has been completed, signed by MD and returned to medical records.

## 2018-11-02 ENCOUNTER — Ambulatory Visit: Payer: 59

## 2018-11-02 DIAGNOSIS — R202 Paresthesia of skin: Secondary | ICD-10-CM | POA: Diagnosis not present

## 2018-11-02 MED ORDER — GADOBENATE DIMEGLUMINE 529 MG/ML IV SOLN
20.0000 mL | Freq: Once | INTRAVENOUS | Status: AC | PRN
  Administered 2018-11-02: 20 mL via INTRAVENOUS

## 2018-11-04 ENCOUNTER — Telehealth: Payer: Self-pay | Admitting: *Deleted

## 2018-11-04 NOTE — Telephone Encounter (Signed)
Spoke to patient and she is aware of her results.

## 2018-11-04 NOTE — Telephone Encounter (Signed)
-----   Message from Marcial Pacas, MD sent at 11/04/2018  1:22 PM EST ----- Please call patient for normal MRA of brain and neck

## 2018-11-07 ENCOUNTER — Telehealth: Payer: Self-pay | Admitting: Neurology

## 2018-11-07 LAB — HYPERCOAGULABLE PANEL, COMPREHENSIVE
APTT: 24 s
AT III Act/Nor PPP Chro: 92 %
Act. Prt C Resist w/FV Defic.: 2.5 ratio
Anticardiolipin Ab, IgG: <10 [GPL'U]
Anticardiolipin Ab, IgM: <10 [MPL'U]
Beta-2 Glycoprotein I, IgA: <10 SAU
Beta-2 Glycoprotein I, IgG: <10 SGU
DRVVT Screen Seconds: 27.6 s
Factor VII Antigen**: 67 %
Factor VIII Activity: 160 %
Hexagonal Phospholipid Neutral: 0 s
Homocysteine: 8.7 umol/L
PROT S AG ACT/NOR PPP IMM: 85 %
Prot C Ag Act/Nor PPP Imm: 81 %
Protein C Ag/FVII Ag Ratio**: 1.2 ratio
Protein S Ag/FVII Ag Ratio**: 1.3 ratio

## 2018-11-07 LAB — ANA W/REFLEX IF POSITIVE
Anti JO-1: <0.2 AI (ref 0.0–0.9)
Anti Nuclear Antibody(ANA): POSITIVE — AB
Centromere Ab Screen: <0.2 AI (ref 0.0–0.9)
ENA RNP Ab: <0.2 AI (ref 0.0–0.9)
ENA SM Ab Ser-aCnc: <0.2 AI (ref 0.0–0.9)
ENA SSA (RO) AB: 0.2 AI (ref 0.0–0.9)
ENA SSB (LA) Ab: 0.3 AI (ref 0.0–0.9)
Scleroderma SCL-70: 1.1 AI — ABNORMAL HIGH (ref 0.0–0.9)
dsDNA Ab: 2 IU/mL (ref 0–9)

## 2018-11-07 LAB — FERRITIN: Ferritin: 15 ng/mL (ref 15–150)

## 2018-11-07 LAB — IRON AND TIBC
Iron Saturation: 21 % (ref 15–55)
Iron: 102 ug/dL (ref 27–159)
Total Iron Binding Capacity: 479 ug/dL — ABNORMAL HIGH (ref 250–450)
UIBC: 377 ug/dL (ref 131–425)

## 2018-11-07 LAB — VITAMIN B12: VITAMIN B 12: 454 pg/mL (ref 232–1245)

## 2018-11-07 LAB — C-REACTIVE PROTEIN: CRP: 8 mg/L (ref 0–10)

## 2018-11-07 LAB — TSH: TSH: 1.54 u[IU]/mL (ref 0.450–4.500)

## 2018-11-07 LAB — CK: Total CK: 153 U/L (ref 24–173)

## 2018-11-07 LAB — SEDIMENTATION RATE: Sed Rate: 33 mm/hr — ABNORMAL HIGH (ref 0–32)

## 2018-11-07 LAB — RPR: RPR: NONREACTIVE

## 2018-11-07 LAB — HIV ANTIBODY (ROUTINE TESTING W REFLEX): HIV SCREEN 4TH GENERATION: NONREACTIVE

## 2018-11-07 NOTE — Telephone Encounter (Signed)
Left patient a detailed message, with results, on voicemail (ok per DPR).  Provided our number to call back with any questions.  

## 2018-11-07 NOTE — Telephone Encounter (Signed)
Please call patient, there is no significant abnormalities on laboratory evaluations.

## 2018-12-13 ENCOUNTER — Ambulatory Visit (INDEPENDENT_AMBULATORY_CARE_PROVIDER_SITE_OTHER): Payer: 59 | Admitting: Neurology

## 2018-12-13 ENCOUNTER — Encounter: Payer: Self-pay | Admitting: Neurology

## 2018-12-13 VITALS — BP 192/98 | HR 82 | Ht 65.0 in | Wt 159.0 lb

## 2018-12-13 DIAGNOSIS — I10 Essential (primary) hypertension: Secondary | ICD-10-CM

## 2018-12-13 DIAGNOSIS — R202 Paresthesia of skin: Secondary | ICD-10-CM | POA: Diagnosis not present

## 2018-12-13 NOTE — Progress Notes (Signed)
PATIENT: Rachael Paul DOB: January 04, 1976  Chief Complaint  Patient presents with  . Numbness    She is here to review her MRA, ECHO and lab results.  She has continued taking aspirin '81mg'$  daily. She still has mild, intermittent numbness in fingertips.      HISTORICAL  Rachael Paul is a 43 year old female, seen in request by her primary care physician from [place], [facility] for evaluation of right side paresthesia initial evaluation was on October 27, 2018.  I have reviewed and summarized the referring note from the referring physician.  She had a history of hypertension, on October 25, 2018, after having breakfast, working as a Restaurant manager, fast food on the phone, she had sudden onset right hand numbness quickly spreading to involving her right arm, right face, right flank, right lower extremity, she denies weakness, was sent to the emergency room for evaluation, blood pressure was elevated 194/97,  I personally reviewed MRI of the brain, no acute abnormality, scattered supratentorium small vessel disease,  She was started on aspirin 81 mg daily since then, now she continue has right side paresthesia involving her face, arm, hand, trunk, right leg since its onset, blood pressure remains elevated 172/101 today  Laboratory evaluations on October 25, 2018, negative troponin, BNP was mildly elevated 114, normal BMP, CBC  UPDATE December 13 2018: US carotid artery showed no large vessel disease.  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  Ferritin level was 15,  MRA of brain and neck was normal  Her right side paresthesia overall has much improved.  Have elevated blood pressure 192/98 today.  REVIEW OF SYSTEMS: Full 14 system review of systems performed and notable only for paresthesia All other  review of systems were negative.  ALLERGIES: No Known Allergies  HOME MEDICATIONS: Current Outpatient Medications  Medication Sig Dispense Refill  . aspirin 81 MG chewable tablet Chew 1 tablet (81 mg total) by mouth daily. 30 tablet 0  . cloNIDine (CATAPRES) 0.1 MG tablet Take 0.1 mg by mouth 2 (two) times daily.    . hydrochlorothiazide (HYDRODIURIL) 25 MG tablet Take 1 tablet (25 mg total) by mouth daily. 14 tablet 0   No current facility-administered medications for this visit.     PAST MEDICAL HISTORY: Past Medical History:  Diagnosis Date  . Anxiety and depression   . Asthma    childhood  . Bronchitis   . DVT (deep venous thrombosis) (HCC)    Left Leg   . Hypertension   . Numbness     PAST SURGICAL HISTORY: Past Surgical History:  Procedure Laterality Date  . TUBAL LIGATION      FAMILY HISTORY: Family History  Problem Relation Age of Onset  . Other Mother        drug related death  . Diabetes Mother   . Other Father        unsure of history  . Hypertension Maternal Grandmother   . Diabetes Maternal Grandfather   . Dementia Maternal Grandfather     SOCIAL HISTORY: Social History   Socioeconomic History  . Marital status: Single    Spouse name: Not on file  . Number of children: 4  . Years of education: 2  . Highest education level: High school graduate  Occupational History  . Occupation: Therapist, art  Social Needs  . Emergency planning/management officer  strain: Not on file  . Food insecurity:    Worry: Not on file    Inability: Not on file  . Transportation needs:    Medical: Not on file    Non-medical: Not on file  Tobacco Use  . Smoking status: Current Every Day Smoker    Packs/day: 1.00    Types: Cigarettes  . Smokeless tobacco: Never Used  Substance and Sexual Activity  . Alcohol use: Yes    Comment: 2 glasses of wine daily  . Drug use: No  . Sexual activity: Not Currently  Lifestyle  . Physical activity:    Days per week: Not on file     Minutes per session: Not on file  . Stress: Not on file  Relationships  . Social connections:    Talks on phone: Not on file    Gets together: Not on file    Attends religious service: Not on file    Active member of club or organization: Not on file    Attends meetings of clubs or organizations: Not on file    Relationship status: Not on file  . Intimate partner violence:    Fear of current or ex partner: Not on file    Emotionally abused: Not on file    Physically abused: Not on file    Forced sexual activity: Not on file  Other Topics Concern  . Not on file  Social History Narrative   Lives at home with son.   Right-handed.   Occasional caffeine use.     PHYSICAL EXAM   Vitals:   12/13/18 1424  BP: (!) 192/98  Pulse: 82  Weight: 159 lb (72.1 kg)  Height: '5\' 5"'$  (1.651 m)    Not recorded      Body mass index is 26.46 kg/m.  PHYSICAL EXAMNIATION:  Gen: NAD, conversant, well nourised, obese, well groomed                     Cardiovascular: Regular rate rhythm, no peripheral edema, warm, nontender. Eyes: Conjunctivae clear without exudates or hemorrhage Neck: Supple, no carotid bruits. Pulmonary: Clear to auscultation bilaterally   NEUROLOGICAL EXAM:  MENTAL STATUS: Speech:    Speech is normal; fluent and spontaneous with normal comprehension.  Cognition:     Orientation to time, place and person     Normal recent and remote memory     Normal Attention span and concentration     Normal Language, naming, repeating,spontaneous speech     Fund of knowledge   CRANIAL NERVES: CN II: Visual fields are full to confrontation.  Pupils are round equal and briskly reactive to light. CN III, IV, VI: extraocular movement are normal. No ptosis. CN V: Facial sensation is intact to pinprick in all 3 divisions bilaterally. Corneal responses are intact.  CN VII: Face is symmetric with normal eye closure and smile. CN VIII: Hearing is normal to rubbing fingers CN IX, X:  Palate elevates symmetrically. Phonation is normal. CN XI: Head turning and shoulder shrug are intact CN XII: Tongue is midline with normal movements and no atrophy.  MOTOR: There is no pronator drift of out-stretched arms. Muscle bulk and tone are normal. Muscle strength is normal.  REFLEXES: Reflexes are 2+ and symmetric at the biceps, triceps, knees, and ankles. Plantar responses are flexor.  SENSORY: Intact to light touch, pinprick, positional sensation and vibratory sensation are intact in fingers and toes.  COORDINATION: Rapid alternating movements and fine finger movements are intact.  There is no dysmetria on finger-to-nose and heel-knee-shin.    GAIT/STANCE: Posture is normal. Gait is steady with normal steps, base, arm swing, and turning. Heel and toe walking are normal. Tandem gait is normal.  Romberg is absent.   DIAGNOSTIC DATA (LABS, IMAGING, TESTING) - I reviewed patient records, labs, notes, testing and imaging myself where available.   ASSESSMENT AND PLAN  Malaiya Paczkowski is a 43 y.o. female   Right side paresthesia  Even though MRI of the brain did not show acute stroke, it could not rule out the possibility of a small vessel acute stroke, likely localized to left thalamus  She has vascular risk factor of poorly controlled hypertension  MRI of the brain and neck showed no large vessel disease.  Laboratory evaluation including hypercoagulable showed no significant abnormality  Continue aspirin daily, increase water intake, optimize blood pressure control with goal less than 130/80   Rachael Paul, M.D. Ph.D.  [facility] [address] [place], [place] [address] Ph: [phone] Fax: [phone]  CC: [facility]

## 2019-01-18 DIAGNOSIS — I517 Cardiomegaly: Secondary | ICD-10-CM | POA: Insufficient documentation

## 2019-01-19 ENCOUNTER — Encounter: Payer: Self-pay | Admitting: Cardiology

## 2019-01-19 ENCOUNTER — Ambulatory Visit (INDEPENDENT_AMBULATORY_CARE_PROVIDER_SITE_OTHER): Payer: 59 | Admitting: Cardiology

## 2019-01-19 ENCOUNTER — Other Ambulatory Visit: Payer: Self-pay

## 2019-01-19 VITALS — BP 180/114 | HR 81 | Ht 63.5 in | Wt 165.0 lb

## 2019-01-19 DIAGNOSIS — R0789 Other chest pain: Secondary | ICD-10-CM

## 2019-01-19 DIAGNOSIS — R202 Paresthesia of skin: Secondary | ICD-10-CM

## 2019-01-19 DIAGNOSIS — R0609 Other forms of dyspnea: Secondary | ICD-10-CM

## 2019-01-19 DIAGNOSIS — M7989 Other specified soft tissue disorders: Secondary | ICD-10-CM | POA: Diagnosis not present

## 2019-01-19 DIAGNOSIS — F17200 Nicotine dependence, unspecified, uncomplicated: Secondary | ICD-10-CM

## 2019-01-19 DIAGNOSIS — I10 Essential (primary) hypertension: Secondary | ICD-10-CM | POA: Diagnosis not present

## 2019-01-19 MED ORDER — VALSARTAN 160 MG PO TABS: 160.0000 mg | ORAL_TABLET | Freq: Every day | ORAL | 1 refills | Status: DC

## 2019-01-19 MED ORDER — SPIRONOLACTONE 25 MG PO TABS: 25.0000 mg | ORAL_TABLET | Freq: Every day | ORAL | 1 refills | Status: DC

## 2019-01-19 MED ORDER — NICOTINE 14 MG/24HR TD PT24: 14.0000 mg | MEDICATED_PATCH | Freq: Every day | TRANSDERMAL | 0 refills | Status: DC

## 2019-01-19 NOTE — Progress Notes (Signed)
Subjective:   Rachael Paul, female    DOB: 01/08/1976, 43 y.o.   MRN: [id number]  Chief Complaint  Patient presents with   New Patient (Initial Visit)    htn   Hypertension   This visit type was conducted due to national recommendations for restrictions regarding the COVID-19 Pandemic (e.g. social distancing).  This format is felt to be most appropriate for this patient at this time.  All issues noted in this document were discussed and addressed.  No physical exam was performed (except for noted visual exam findings with Telehealth visits).  The patient has consented to conduct a Telehealth visit and understands insurance will be billed.   I discussed the limitations of evaluation and management by telemedicine and the availability of in person appointments. The patient expressed understanding and agreed to proceed.  Virtual Visit via Video Note is as below  I connected with Rachael Paul, on 01/19/19 at 1130 by a video enabled telemedicine application and verified that I am speaking with the correct person using two identifiers.     I have discussed with her regarding the safety during COVID Pandemic and steps and precautions including social distancing with the patient.    Patient referred by Shanon Rosser, PA for uncontrolled hypertension.  HPI:  Rachael Paul is a 43 y.o. African American female with ongoing tobacco use disorder, resistant hypertension, recent questionable TIA/CVA in Jan 2020 referred to [place] for evaluation of difficult to control hypertension.  Patient recently had an episode of right sided parathesia in early Jan 2020. Underwent MRI at that time that did not reveal acute CVA. Blood pressure was markedly elevated at that time with systolic readings in the 716'R. By report, she had been out of her medication for 3 weeks. She is now on daily ASA. She has been evaluated by Neurology who felt that although her MRI of the brain was negative for acute CVA it could  not rule out small vessel acute CVA. Her symptoms have since improved. She has had negative MRA of neck in March 2020. Echocardiogram was also ordered but does not appear to have been performed. She has had hypercoaguable workup as she previously had a DVT in the remote past with pregnancy and her son has also had a DVT.   Currently for blood pressure management, she is on Bystolic, HCTZ, and amlodipine. She was previously on lisinopril; however, developed cough. She also had fatigue with clonidine. States that her hypertension was diagnosed a few years ago, and does admit that in the past she has not been compliant with her medications. Denies any thyroid disorders or diabetes. Unsure of cholesterol levels.  She does have occasional episodes of chest discomfort that does not radiate to her arm and not associated with exertion. She does have dyspnea on exertion and daily leg swelling. No PND or orthopnea. No syncope. Occasional palpitations that occur at rest and last for a few seconds. Also mentions increased awareness of heart beat. She does smoke 1 pack per day. She is interested in quitting. No illicit drug use.  She works at [employer] as a Radiation protection practitioner. She has 3 living children and 1 deceased child. She does not exercise. Denies any family history of heart disease or sudden cardiac death.  Past Medical History:  Diagnosis Date   Anxiety and depression    Asthma    childhood   Bronchitis    DVT (deep venous thrombosis) (HCC)    Left Leg  Hypertension    Numbness     Past Surgical History:  Procedure Laterality Date   TUBAL LIGATION      Family History  Problem Relation Age of Onset   Other Mother        drug related death   Diabetes Mother    Other Father        unsure of history   Hypertension Maternal Grandmother    Diabetes Maternal Grandmother    Diabetes Maternal Grandfather    Dementia Maternal Grandfather    Hypertension Maternal  Grandfather    Pulmonary embolism Son     Social History   Socioeconomic History   Marital status: Single    Spouse name: Not on file   Number of children: 4   Years of education: 12   Highest education level: High school graduate  Occupational History   Occupation: Engineer, building services strain: Not on file   Food insecurity:    Worry: Not on file    Inability: Not on file   Transportation needs:    Medical: Not on file    Non-medical: Not on file  Tobacco Use   Smoking status: Current Every Day Smoker    Packs/day: 1.00    Types: Cigarettes   Smokeless tobacco: Never Used  Substance and Sexual Activity   Alcohol use: Yes    Comment: 2 glasses of wine daily   Drug use: No   Sexual activity: Not Currently  Lifestyle   Physical activity:    Days per week: Not on file    Minutes per session: Not on file   Stress: Not on file  Relationships   Social connections:    Talks on phone: Not on file    Gets together: Not on file    Attends religious service: Not on file    Active member of club or organization: Not on file    Attends meetings of clubs or organizations: Not on file    Relationship status: Not on file   Intimate partner violence:    Fear of current or ex partner: Not on file    Emotionally abused: Not on file    Physically abused: Not on file    Forced sexual activity: Not on file  Other Topics Concern   Not on file  Social History Narrative   Lives at home with son.   Right-handed.   Occasional caffeine use.    Current Outpatient Medications on File Prior to Visit  Medication Sig Dispense Refill   AMLODIPINE BESYLATE PO Take by mouth.     aspirin 81 MG chewable tablet Chew 1 tablet (81 mg total) by mouth daily. 30 tablet 0   cetirizine-pseudoephedrine (ZYRTEC-D) 5-120 MG tablet Take 1 tablet by mouth 2 (two) times daily.     HYDROCHLOROTHIAZIDE PO Take 25 mg by mouth daily.      NEBIVOLOL HCL PO  Take 5 mg by mouth daily.      No current facility-administered medications on file prior to visit.      Review of Systems  Constitution: Negative for decreased appetite, malaise/fatigue, weight gain and weight loss.  Eyes: Negative for visual disturbance.  Cardiovascular: Positive for chest pain, leg swelling and palpitations. Negative for claudication, dyspnea on exertion, orthopnea and syncope.  Respiratory: Positive for shortness of breath. Negative for hemoptysis and wheezing.   Endocrine: Negative for cold intolerance and heat intolerance.  Hematologic/Lymphatic: Does not bruise/bleed easily.  Skin: Negative for  nail changes.  Musculoskeletal: Negative for muscle weakness and myalgias.  Gastrointestinal: Negative for abdominal pain, change in bowel habit, nausea and vomiting.  Neurological: Negative for difficulty with concentration, dizziness, focal weakness and headaches.  Psychiatric/Behavioral: Negative for altered mental status and suicidal ideas.  All other systems reviewed and are negative.      Objective:     Blood pressure (!) 180/114, pulse 81, height 5' 3.5" (1.613 m), weight 165 lb (74.8 kg).  Physical Exam  Constitutional: She is oriented to person, place, and time. She appears well-developed and well-nourished. No distress.  HENT:  Head: Normocephalic.  Neck: No tracheal deviation present. No thyromegaly present.  Cardiovascular:  Left leg varicose vein noted; no edema noted  Pulmonary/Chest: Effort normal. No respiratory distress.  Neurological: She is alert and oriented to person, place, and time.  Psychiatric: She has a normal mood and affect. Her behavior is normal.  Vitals reviewed.  Radiology:   MRA head 11/02/2018: This appears to be a normal MR angiogram of the intracranial arteries. There is some movement artifact limiting interpretation.  However, there did not appear to be any hemodynamically significant regions of stenosis intracranial  aneurysms.  MRA neck 11/02/2018: This is a normal MR angiogram of the intracranial arteries.  MRI brain 10/25/2018: 1. Pattern of white matter lesions most suggestive of chronic ischemic microangiopathy, with no specific features of demyelination. 2. No abnormal contrast enhancement.  MRI brain WO contrast 10/25/2018: 1. Multiple bilateral periventricular and subcortical T2 hyperintensities bilaterally. There is some involvement of the callososeptal margin. The pattern suggests a demyelinating process. Given the history of hypertension, chronic microvascular ischemic changes are noted as well. Differential diagnosis includes vasculitis or prior infection less likely. 2. No acute intracranial abnormality.   Cardiac studies:   PCP EKG 01/02/2019: Normal sinus rhythm at 70 bpm, normal axis, LVH. No evidence of ischemia.   Recent Labs: 01/03/2019: HgbA1c 4.9%. TSH 2.1. CBC normal. Creatinine 0.83, eGFR 86/100, potassium 4.2, CMP normal.        Assessment & Recommendations:   1. Essential hypertension She continues to have marked hypertension. Still have room to optimize her current medications. She likely has hypertensive heart disease given her LVH by EKG and cardiomegaly by CXR. She would benefit from ACE inhibitor or ARB. As she had a cough with Lisinopril, will start Valsartan 172m daily. Will also add Aldactone 25 mg daily. Continue with other medications. If she continues to have unresponsive medication with optimizing her medications and addition of these medications, will consider secondary causes for hypertension. Discussed low sodium diet with controlling her blood pressure.   2. Leg swelling She reports almost daily ankle edema. This was not noted by exam today; however, exam was limited in view of being virtual visit. As stated above, will add Aldactone 25 mg daily in the morning both for her leg swelling and also for improved BP control. Will check BMP in 2 weeks for follow  up on kidney function and potassium level.  3. Dyspnea on exertion Likely secondary to hypertension, although heart failure will need to be excluded. Will obtain echocardiogram for further evaluation.   4. Atypical chest pain Suspect is related to hypertension. Does not occur with exertion. Once blood pressure is controlled, may consider stress testing if clinically indicated.   5. Paresthesia Has essentially resolved, but does continue to have occasional right finger numbness. She continues to be on ASA.   6. Tobacco use disorder Lengthy discussion with the patient regarding the importance  of smoking cessation to further decrease her risk or complications. Multiple options including ziplock bag technique, nicotine patches, lozenges, or medications were discussed. She wishes to try nicorette patches. I will send in prescription for this. If she does not have good success with this, will consider Chantix. Will continue to monitor her progress.   Recommendations: I will tentatively schedule the patient for follow up in the office in the next 4 weeks for close monitoring of her blood pressure and to discuss echo results. Advised her to contact me sooner for any problems or concerns.    Thank you for referring this patient. Please do not hesitate to contact me for any questions.   Jeri Lager, MSN, APRN, FNP-C [facility], [place] Office: [phone] Fax: [phone]

## 2019-02-17 ENCOUNTER — Telehealth: Payer: Self-pay

## 2019-02-24 LAB — BASIC METABOLIC PANEL
BUN/Creatinine Ratio: 22 (ref 9–23)
BUN: 20 mg/dL (ref 6–24)
CO2: 21 mmol/L (ref 20–29)
Calcium: 9.6 mg/dL (ref 8.7–10.2)
Chloride: 99 mmol/L (ref 96–106)
Creatinine, Ser: 0.9 mg/dL (ref 0.57–1.00)
GFR calc Af Amer: 91 mL/min/{1.73_m2} (ref >59)
GFR calc non Af Amer: 79 mL/min/{1.73_m2} (ref >59)
Glucose: 87 mg/dL (ref 65–99)
Potassium: 4.7 mmol/L (ref 3.5–5.2)
Sodium: 138 mmol/L (ref 134–144)

## 2019-02-24 NOTE — Progress Notes (Signed)
She has echo on 5/26, does she need a f/u after that?

## 2019-02-24 NOTE — Progress Notes (Signed)
Yes

## 2019-03-07 ENCOUNTER — Ambulatory Visit (INDEPENDENT_AMBULATORY_CARE_PROVIDER_SITE_OTHER): Payer: 59

## 2019-03-07 ENCOUNTER — Other Ambulatory Visit: Payer: Self-pay

## 2019-03-07 DIAGNOSIS — I10 Essential (primary) hypertension: Secondary | ICD-10-CM

## 2019-03-13 ENCOUNTER — Other Ambulatory Visit: Payer: Self-pay | Admitting: Cardiology

## 2019-03-13 DIAGNOSIS — I10 Essential (primary) hypertension: Secondary | ICD-10-CM

## 2019-03-13 NOTE — Telephone Encounter (Signed)
Please fill

## 2019-03-14 ENCOUNTER — Other Ambulatory Visit: Payer: Self-pay

## 2019-03-14 ENCOUNTER — Ambulatory Visit (INDEPENDENT_AMBULATORY_CARE_PROVIDER_SITE_OTHER): Payer: 59 | Admitting: Cardiology

## 2019-03-14 ENCOUNTER — Encounter: Payer: Self-pay | Admitting: Cardiology

## 2019-03-14 VITALS — BP 176/95 | HR 82 | Temp 99.1°F | Ht 64.0 in | Wt 163.6 lb

## 2019-03-14 DIAGNOSIS — R5383 Other fatigue: Secondary | ICD-10-CM

## 2019-03-14 DIAGNOSIS — R0789 Other chest pain: Secondary | ICD-10-CM

## 2019-03-14 DIAGNOSIS — F17200 Nicotine dependence, unspecified, uncomplicated: Secondary | ICD-10-CM

## 2019-03-14 DIAGNOSIS — R202 Paresthesia of skin: Secondary | ICD-10-CM

## 2019-03-14 DIAGNOSIS — I119 Hypertensive heart disease without heart failure: Secondary | ICD-10-CM

## 2019-03-14 DIAGNOSIS — R0609 Other forms of dyspnea: Secondary | ICD-10-CM | POA: Diagnosis not present

## 2019-03-14 DIAGNOSIS — R768 Other specified abnormal immunological findings in serum: Secondary | ICD-10-CM

## 2019-03-14 MED ORDER — SPIRONOLACTONE 50 MG PO TABS: 50.0000 mg | ORAL_TABLET | Freq: Every day | ORAL | 3 refills | Status: DC

## 2019-03-14 MED ORDER — NEBIVOLOL HCL 10 MG PO TABS: 10.0000 mg | ORAL_TABLET | Freq: Every day | ORAL | 1 refills | Status: DC

## 2019-03-14 NOTE — Progress Notes (Signed)
Primary Physician:  Shanon Rosser, PA-C   Patient ID: Rachael Paul, female    DOB: [birth year], 43 y.o.   MRN: [id number]  Subjective:    Chief Complaint  Patient presents with   Hypertension   cardiomegaly   Results    labs, echo   Follow-up    HPI: [name]  is a 43 y.o. female  with ongoing tobacco use disorder, resistant hypertension, and recent questionable TIA/CVA in Jan 2020.  Patient had an episode of right sided parathesia in early Jan 2020. Underwent MRI at that time that did not reveal acute CVA. Blood pressure was markedly elevated at that time with systolic readings in the 595'G. By report, she had been out of her medication for 3 weeks. She is now on daily ASA. She has been evaluated by Neurology who felt that although her MRI of the brain was negative for acute CVA it could not rule out small vessel acute CVA. Her symptoms have since improved. She has had negative MRA of neck in March 2020. She has had hypercoaguable workup both by Neurology and Hematology in 2016, that she states was negative as she previously had a DVT in the remote past with pregnancy. Unfortunately, in May 2016 she lost her 84 year old son due to pulmonary embolism. Potentially related to paternal genetic mutation?  At her last office visit, she was started on Aldactone and valsartan, tolerating well.  Blood pressure has improved with this.  She underwent echocardiogram and now presents discuss results.  She does have occasional episodes of chest discomfort that does not radiate to her arm and not associated with exertion. She does have dyspnea on exertion that is stable.  Leg edema has improved since being on Aldactone.  Does mention snoring and states that she always wakes up tired.  No PND or orthopnea. No syncope. Occasional palpitations that occur at rest and last for a few seconds. Also mentions increased awareness of heart beat.  Since her last office visit, she has been able to cut  back on tobacco use to half a pack per day.  No illicit drug use.  She works at [employer] as a Radiation protection practitioner. She has 3 living children and 1 deceased child. She does not exercise. Denies any family history of heart disease or sudden cardiac death.  Past Medical History:  Diagnosis Date   Anxiety and depression    Asthma    childhood   Bronchitis    DVT (deep venous thrombosis) (HCC)    Left Leg    Hypertension    Numbness     Past Surgical History:  Procedure Laterality Date   TUBAL LIGATION      Social History   Socioeconomic History   Marital status: Single    Spouse name: Not on file   Number of children: 4   Years of education: 12   Highest education level: High school graduate  Occupational History   Occupation: Therapist, art  Social Needs   Financial resource strain: Not on file   Food insecurity:    Worry: Not on file    Inability: Not on file   Transportation needs:    Medical: Not on file    Non-medical: Not on file  Tobacco Use   Smoking status: Current Every Day Smoker    Packs/day: 1.00    Types: Cigarettes   Smokeless tobacco: Never Used  Substance and Sexual Activity   Alcohol use: Yes    Comment: 2  glasses of wine daily   Drug use: No   Sexual activity: Not Currently  Lifestyle   Physical activity:    Days per week: Not on file    Minutes per session: Not on file   Stress: Not on file  Relationships   Social connections:    Talks on phone: Not on file    Gets together: Not on file    Attends religious service: Not on file    Active member of club or organization: Not on file    Attends meetings of clubs or organizations: Not on file    Relationship status: Not on file   Intimate partner violence:    Fear of current or ex partner: Not on file    Emotionally abused: Not on file    Physically abused: Not on file    Forced sexual activity: Not on file  Other Topics Concern   Not on file  Social  History Narrative   Lives at home with son.   Right-handed.   Occasional caffeine use.    Review of Systems  Constitution: Positive for malaise/fatigue. Negative for decreased appetite, weight gain and weight loss.  Eyes: Negative for visual disturbance.  Cardiovascular: Positive for chest pain and palpitations. Negative for claudication, dyspnea on exertion, leg swelling, orthopnea and syncope.  Respiratory: Positive for shortness of breath and snoring. Negative for hemoptysis and wheezing.   Endocrine: Negative for cold intolerance and heat intolerance.  Hematologic/Lymphatic: Does not bruise/bleed easily.  Skin: Negative for nail changes.  Musculoskeletal: Negative for muscle weakness and myalgias.  Gastrointestinal: Negative for abdominal pain, change in bowel habit, nausea and vomiting.  Neurological: Negative for difficulty with concentration, dizziness, focal weakness and headaches.  Psychiatric/Behavioral: Negative for altered mental status and suicidal ideas.  All other systems reviewed and are negative.     Objective:  Blood pressure (!) 176/95, pulse 82, temperature 99.1 F (37.3 C), height '5\' 4"'$  (1.626 m), weight 163 lb 9.6 oz (74.2 kg), SpO2 98 %. Body mass index is 28.08 kg/m.    Physical Exam  Constitutional: She is oriented to person, place, and time. Vital signs are normal. She appears well-developed and well-nourished.  HENT:  Head: Normocephalic and atraumatic.  Neck: Normal range of motion. Neck supple. Carotid bruit is present.  Cardiovascular: Normal rate, regular rhythm and intact distal pulses.  Murmur heard.  Early systolic murmur is present with a grade of 1/6 at the upper right sternal border. Pulses:      Carotid pulses are on the left side with bruit. Pulmonary/Chest: Effort normal and breath sounds normal. No accessory muscle usage. No respiratory distress.  Abdominal: Soft. Bowel sounds are normal.  Musculoskeletal: Normal range of motion.   Neurological: She is alert and oriented to person, place, and time.  Skin: Skin is warm and dry.  Vitals reviewed.  Radiology:  MRA of neck 11/02/2018: This is a normal MR angiogram of the intracranial arteries.  MRA of head 11/02/2018: This appears to be a normal MR angiogram of the intracranial arteries.     There is some movement artifact limiting interpretation.  However, there did not appear to be any hemodynamically significant regions of stenosis intracranial aneurysms.  US carotid artery showed no large vessel disease. (Per Dr. Krista Blue note)  Laboratory examination:   Recent Labs: 01/03/2019: HgbA1c 4.9%. TSH 2.1. CBC normal. Creatinine 0.83, eGFR 86/100, potassium 4.2, CMP normal.   10/27/2018: mild elevated ESR 33, ANA was positive with mild scleroderma titer 1.1, hypercoagulation  panels were negative.  CMP Latest Ref Rng & Units 02/23/2019 10/25/2018 04/06/2015  Glucose 65 - 99 mg/dL 87 113(H) 111(H)  BUN 6 - 24 mg/dL '20 10 12  '$ Creatinine 0.57 - 1.00 mg/dL 0.90 0.72 0.71  Sodium 134 - 144 mmol/L 138 136 136  Potassium 3.5 - 5.2 mmol/L 4.7 3.7 3.6  Chloride 96 - 106 mmol/L 99 107 105  CO2 20 - 29 mmol/L '21 22 23  '$ Calcium 8.7 - 10.2 mg/dL 9.6 8.7(L) 8.8(L)   CBC Latest Ref Rng & Units 10/25/2018 04/06/2015 07/16/2010  WBC 4.0 - 10.5 K/uL 4.5 4.5 7.1  Hemoglobin 12.0 - 15.0 g/dL 12.8 11.2(L) 12.0  Hematocrit 36.0 - 46.0 % 41.3 35.5(L) 36.6  Platelets 150 - 400 K/uL 234 274 253   Lipid Panel  No results found for: CHOL, TRIG, HDL, CHOLHDL, VLDL, LDLCALC, LDLDIRECT HEMOGLOBIN A1C Lab Results  Component Value Date   HGBA1C 5.0 04/05/2015   MPG 97 04/05/2015   TSH Recent Labs    10/27/18 1533  TSH 1.540    PRN Meds:. Medications Discontinued During This Encounter  Medication Reason   NEBIVOLOL HCL PO Discontinued by provider   spironolactone (ALDACTONE) 25 MG tablet Discontinued by provider   Current Meds  Medication Sig   amLODipine (NORVASC) 10 MG tablet  Take by mouth daily.    aspirin 81 MG chewable tablet Chew 1 tablet (81 mg total) by mouth daily.   cetirizine-pseudoephedrine (ZYRTEC-D) 5-120 MG tablet Take 1 tablet by mouth as needed.    HYDROCHLOROTHIAZIDE PO Take 25 mg by mouth daily.    nicotine (NICODERM CQ - DOSED IN MG/24 HOURS) 14 mg/24hr patch Place 1 patch (14 mg total) onto the skin daily.   [DISCONTINUED] NEBIVOLOL HCL PO Take 5 mg by mouth daily.    [DISCONTINUED] spironolactone (ALDACTONE) 25 MG tablet Take 1 tablet (25 mg total) by mouth daily.   [DISCONTINUED] valsartan (DIOVAN) 160 MG tablet Take 1 tablet (160 mg total) by mouth daily.    Cardiac Studies:   Echocardiogram 03/07/2019: Normal LV systolic function with EF 56%. Left ventricle cavity is normal in size. Moderate concentric hypertrophy of the left ventricle with focal basal septal hypertrophy. No LVOT gradient noted.  Normal global wall motion. Doppler evidence of grade I (impaired) diastolic dysfunction, normal LAP. Calculated EF 56%. Left atrial cavity is mildly dilated. Mild tricuspid regurgitation. Estimated pulmonary artery systolic pressure 22 mmHg.  Assessment:   Hypertensive heart disease without heart failure  Dyspnea on exertion  Atypical chest pain  Fatigue, unspecified type  Paresthesia  Positive ANA (antinuclear antibody)  Tobacco use disorder  PCP EKG 01/02/2019: Normal sinus rhythm at 70 bpm, normal axis, LVH. No evidence of ischemia.   Recommendations:   Patient with ongoing tobacco use disorder, resistant hypertension, and recent questionable TIA/CVA in Jan 2020.  Blood pressure has improved with addition of Aldactone and Valsartan, but continues to be markedly elevated. Noted to have moderate LVH with focal basal septal hypertrophy on recent echocardiogram secondary to hypertension. If she continues to have resistant hypertension despite additional medications, will consider renal artery duplex to exclude renal artery  stenosis. Will increase Aldactone to 50 mg daily, kidney function remained stable. Would recommend continuing Bystolic in view of her hypertrophy, will increase Bystolic to '10mg'$ . Discussed low sodium diet. Suspect that her dyspnea and occasional atypical chest pain is related to uncontrolled hypertension. Chest pain not associated with exertion. May consider stress testing in view of her risk factors, once BP  is better controlled. She does have fatigue and snoring and given her uncontrolled hypertension, question if she may have underlying sleep apnea. Feel that it may be worthwhile to have her evaluated for possible sleep study. Will place referral for this.   She has been evaluated by Neurology for right sided paresthesia, small vessel TIA/CVA could not be excluded. Paresthesia has essentially resolved; however, continues to have occasional residual in her finger tips. She has been found to have positive ANA with mild elevation in scleroderma titer. Will defer to PCP for evaluation. No evidence of pulmonary hypertension by echo.   Has right carotid bruit on exam along with systolic ejection murmur. Has had negative carotid duplex and MRA of neck. She is on ASA given questionable CVA. She would likely benefit from statin therapy. Will obtain lipids from PCP office and will discuss adding this at her next office visit. She has been able to cut back on cigarette use to 1/2 pack. I have encouraged her to continue to work on this for complete smoking cessation. I will see her back in 4 weeks for follow up on hypertension.   Miquel Dunn, MSN, APRN, FNP-C [facility]. [place] Office: [phone] Fax: [phone]

## 2019-03-14 NOTE — Patient Instructions (Signed)
DASH Eating Plan  DASH stands for "Dietary Approaches to Stop Hypertension." The DASH eating plan is a healthy eating plan that has been shown to reduce high blood pressure (hypertension). It may also reduce your risk for type 2 diabetes, heart disease, and stroke. The DASH eating plan may also help with weight loss.  What are tips for following this plan?    General guidelines   Avoid eating more than 2,300 mg (milligrams) of salt (sodium) a day. If you have hypertension, you may need to reduce your sodium intake to 1,500 mg a day.   Limit alcohol intake to no more than 1 drink a day for nonpregnant women and 2 drinks a day for men. One drink equals 12 oz of beer, 5 oz of wine, or 1 oz of hard liquor.   Work with your health care provider to maintain a healthy body weight or to lose weight. Ask what an ideal weight is for you.   Get at least 30 minutes of exercise that causes your heart to beat faster (aerobic exercise) most days of the week. Activities may include walking, swimming, or biking.   Work with your health care provider or diet and nutrition specialist (dietitian) to adjust your eating plan to your individual calorie needs.  Reading food labels     Check food labels for the amount of sodium per serving. Choose foods with less than 5 percent of the Daily Value of sodium. Generally, foods with less than 300 mg of sodium per serving fit into this eating plan.   To find whole grains, look for the word "whole" as the first word in the ingredient list.  Shopping   Buy products labeled as "low-sodium" or "no salt added."   Buy fresh foods. Avoid canned foods and premade or frozen meals.  Cooking   Avoid adding salt when cooking. Use salt-free seasonings or herbs instead of table salt or sea salt. Check with your health care provider or pharmacist before using salt substitutes.   Do not fry foods. Cook foods using healthy methods such as baking, boiling, grilling, and broiling instead.   Cook with  heart-healthy oils, such as olive, canola, soybean, or sunflower oil.  Meal planning   Eat a balanced diet that includes:  ? 5 or more servings of fruits and vegetables each day. At each meal, try to fill half of your plate with fruits and vegetables.  ? Up to 6-8 servings of whole grains each day.  ? Less than 6 oz of lean meat, poultry, or fish each day. A 3-oz serving of meat is about the same size as a deck of cards. One egg equals 1 oz.  ? 2 servings of low-fat dairy each day.  ? A serving of nuts, seeds, or beans 5 times each week.  ? Heart-healthy fats. Healthy fats called Omega-3 fatty acids are found in foods such as flaxseeds and coldwater fish, like sardines, salmon, and mackerel.   Limit how much you eat of the following:  ? Canned or prepackaged foods.  ? Food that is high in trans fat, such as fried foods.  ? Food that is high in saturated fat, such as fatty meat.  ? Sweets, desserts, sugary drinks, and other foods with added sugar.  ? Full-fat dairy products.   Do not salt foods before eating.   Try to eat at least 2 vegetarian meals each week.   Eat more home-cooked food and less restaurant, buffet, and fast food.     When eating at a restaurant, ask that your food be prepared with less salt or no salt, if possible.  What foods are recommended?  The items listed may not be a complete list. Talk with your dietitian about what dietary choices are best for you.  Grains  Whole-grain or whole-wheat bread. Whole-grain or whole-wheat pasta. Brown rice. Oatmeal. Quinoa. Bulgur. Whole-grain and low-sodium cereals. Pita bread. Low-fat, low-sodium crackers. Whole-wheat flour tortillas.  Vegetables  Fresh or frozen vegetables (raw, steamed, roasted, or grilled). Low-sodium or reduced-sodium tomato and vegetable juice. Low-sodium or reduced-sodium tomato sauce and tomato paste. Low-sodium or reduced-sodium canned vegetables.  Fruits  All fresh, dried, or frozen fruit. Canned fruit in natural juice (without  added sugar).  Meat and other protein foods  Skinless chicken or turkey. Ground chicken or turkey. Pork with fat trimmed off. Fish and seafood. Egg whites. Dried beans, peas, or lentils. Unsalted nuts, nut butters, and seeds. Unsalted canned beans. Lean cuts of beef with fat trimmed off. Low-sodium, lean deli meat.  Dairy  Low-fat (1%) or fat-free (skim) milk. Fat-free, low-fat, or reduced-fat cheeses. Nonfat, low-sodium ricotta or cottage cheese. Low-fat or nonfat yogurt. Low-fat, low-sodium cheese.  Fats and oils  Soft margarine without trans fats. Vegetable oil. Low-fat, reduced-fat, or light mayonnaise and salad dressings (reduced-sodium). Canola, safflower, olive, soybean, and sunflower oils. Avocado.  Seasoning and other foods  Herbs. Spices. Seasoning mixes without salt. Unsalted popcorn and pretzels. Fat-free sweets.  What foods are not recommended?  The items listed may not be a complete list. Talk with your dietitian about what dietary choices are best for you.  Grains  Baked goods made with fat, such as croissants, muffins, or some breads. Dry pasta or rice meal packs.  Vegetables  Creamed or fried vegetables. Vegetables in a cheese sauce. Regular canned vegetables (not low-sodium or reduced-sodium). Regular canned tomato sauce and paste (not low-sodium or reduced-sodium). Regular tomato and vegetable juice (not low-sodium or reduced-sodium). Pickles. Olives.  Fruits  Canned fruit in a light or heavy syrup. Fried fruit. Fruit in cream or butter sauce.  Meat and other protein foods  Fatty cuts of meat. Ribs. Fried meat. Bacon. Sausage. Bologna and other processed lunch meats. Salami. Fatback. Hotdogs. Bratwurst. Salted nuts and seeds. Canned beans with added salt. Canned or smoked fish. Whole eggs or egg yolks. Chicken or turkey with skin.  Dairy  Whole or 2% milk, cream, and half-and-half. Whole or full-fat cream cheese. Whole-fat or sweetened yogurt. Full-fat cheese. Nondairy creamers. Whipped toppings.  Processed cheese and cheese spreads.  Fats and oils  Butter. Stick margarine. Lard. Shortening. Ghee. Bacon fat. Tropical oils, such as coconut, palm kernel, or palm oil.  Seasoning and other foods  Salted popcorn and pretzels. Onion salt, garlic salt, seasoned salt, table salt, and sea salt. Worcestershire sauce. Tartar sauce. Barbecue sauce. Teriyaki sauce. Soy sauce, including reduced-sodium. Steak sauce. Canned and packaged gravies. Fish sauce. Oyster sauce. Cocktail sauce. Horseradish that you find on the shelf. Ketchup. Mustard. Meat flavorings and tenderizers. Bouillon cubes. Hot sauce and Tabasco sauce. Premade or packaged marinades. Premade or packaged taco seasonings. Relishes. Regular salad dressings.  Where to find more information:   National Heart, Lung, and Blood Institute: www.nhlbi.nih.gov   American Heart Association: www.heart.org  Summary   The DASH eating plan is a healthy eating plan that has been shown to reduce high blood pressure (hypertension). It may also reduce your risk for type 2 diabetes, heart disease, and stroke.   With the   DASH eating plan, you should limit salt (sodium) intake to 2,300 mg a day. If you have hypertension, you may need to reduce your sodium intake to 1,500 mg a day.   When on the DASH eating plan, aim to eat more fresh fruits and vegetables, whole grains, lean proteins, low-fat dairy, and heart-healthy fats.   Work with your health care provider or diet and nutrition specialist (dietitian) to adjust your eating plan to your individual calorie needs.  This information is not intended to replace advice given to you by your health care provider. Make sure you discuss any questions you have with your health care provider.  Document Released: 09/17/2011 Document Revised: 09/21/2016 Document Reviewed: 09/21/2016  Elsevier Interactive Patient Education  2019 [employer]

## 2019-03-22 ENCOUNTER — Telehealth: Payer: Self-pay | Admitting: Neurology

## 2019-03-22 NOTE — Telephone Encounter (Signed)

## 2019-03-27 NOTE — Telephone Encounter (Signed)
I called Rachael Paul. Rachael Paul's meds, allergies, and PMH were updated.  Rachael Paul has never had a sleep study but does endorse snoring.  Rachael Paul's weight is 163 lbs and she is 5'4.  Rachael Paul was instructed on how to measure her neck size prior to her appt.  Epworth Sleepiness Scale 0= would never doze 1= slight chance of dozing 2= moderate chance of dozing 3= high chance of dozing  Sitting and reading: 0 Watching TV: 0 Sitting inactive in a public place (ex. Theater or meeting): 1 As a passenger in a car for an hour without a break: 0 Lying down to rest in the afternoon: 1 Sitting and talking to someone: 0 Sitting quietly after lunch (no alcohol): 1 In a car, while stopped in traffic: 1 Total: 4  FSS: 41

## 2019-03-27 NOTE — Telephone Encounter (Signed)
I called pt to discuss updating her chart. No answer, left a message asking her to call me back.

## 2019-03-28 ENCOUNTER — Telehealth: Payer: Self-pay | Admitting: Neurology

## 2019-03-28 ENCOUNTER — Other Ambulatory Visit: Payer: Self-pay

## 2019-03-28 NOTE — Progress Notes (Signed)
Patient was not able to do MyChart video visit today. [email or web address] was down this moring. Will reschedule her.

## 2019-03-29 ENCOUNTER — Other Ambulatory Visit: Payer: Self-pay | Admitting: Cardiology

## 2019-03-30 ENCOUNTER — Ambulatory Visit (INDEPENDENT_AMBULATORY_CARE_PROVIDER_SITE_OTHER): Payer: 59 | Admitting: Neurology

## 2019-03-30 ENCOUNTER — Other Ambulatory Visit: Payer: Self-pay

## 2019-03-30 ENCOUNTER — Encounter: Payer: Self-pay | Admitting: Neurology

## 2019-03-30 DIAGNOSIS — R351 Nocturia: Secondary | ICD-10-CM | POA: Diagnosis not present

## 2019-03-30 DIAGNOSIS — R51 Headache: Secondary | ICD-10-CM

## 2019-03-30 DIAGNOSIS — E663 Overweight: Secondary | ICD-10-CM

## 2019-03-30 DIAGNOSIS — I119 Hypertensive heart disease without heart failure: Secondary | ICD-10-CM

## 2019-03-30 DIAGNOSIS — I517 Cardiomegaly: Secondary | ICD-10-CM

## 2019-03-30 DIAGNOSIS — R519 Headache, unspecified: Secondary | ICD-10-CM

## 2019-03-30 DIAGNOSIS — R0683 Snoring: Secondary | ICD-10-CM

## 2019-03-30 NOTE — Progress Notes (Signed)
[name], MD, PhD [facility] [address] [place], [place] [address]   Virtual Visit via Video Note on 03/30/2019:  I connected with Ms. Both on 03/30/19 at  3:00 PM EDT by a video enabled telemedicine application and verified that I am speaking with the correct person using two identifiers.   I discussed the limitations of evaluation and management by telemedicine and the availability of in person appointments. The patient expressed understanding and agreed to proceed.  History of Present Illness: [name] is a 43 year old right-handed woman with an underlying medical history of asthma, DVT, hypertension, anxiety, depression, right-sided paresthesia, cardiomegaly and overweight state, who presents for a virtual, video based appointment via [email or web address] for evaluation of her sleep disorder, in particular, concern for underlying obstructive sleep apnea.  The patient is unaccompanied today and joins via cell phone from home, I am located in my office.  She is referred by cardiology, Lenn Cal, nurse practitioner with Dr. Milderd Meager office and I reviewed her office note from 03/16/2019.  The patient's Epworth sleepiness score is 4 out of 24, fatigue severity score is 41 out of 63. She reports snoring and occasional morning headaches, she has woken up with a sense of gasping for air.  She denies a family history of OSA.  She has some trouble falling asleep.  She tries to be in bed around 10, sleeps with the TV on at night, reports that she cannot fall asleep in a quiet or dark room.  She works from home, she works in Therapist, art.  She is a smoker, smokes about 1 pack/day or less.  She drinks no caffeine daily, she drinks alcohol daily in the form of wine, 1 to 2 glasses/day.  She has a variable rise time, typically she is up by 4 or 5 AM.  She does not wake up fully rested.  She has occasional morning headaches.  She has nocturia about once per  average night.  She lives with her 2 children.  She has no pets in the household.  Her weight has been fluctuating a little bit, most recent weight by self-report is 163 pounds, neck circumference by self-report is 12 and three-quarter inches.    Her Past Medical History Is Significant For: Past Medical History:  Diagnosis Date   Anxiety and depression    Asthma    childhood   Bronchitis    DVT (deep venous thrombosis) (HCC)    Left Leg    Hypertension    Numbness     Her Past Surgical History Is Significant For: Past Surgical History:  Procedure Laterality Date   TUBAL LIGATION      Her Family History Is Significant For: Family History  Problem Relation Age of Onset   Other Mother        drug related death   Diabetes Mother    Other Father        unsure of history   Hypertension Maternal Grandmother    Diabetes Maternal Grandmother    Diabetes Maternal Grandfather    Dementia Maternal Grandfather    Hypertension Maternal Grandfather    Pulmonary embolism Son     Her Social History Is Significant For: Social History   Socioeconomic History   Marital status: Single    Spouse name: Not on file   Number of children: 4   Years of education: 12   Highest education level: High school graduate  Occupational History   Occupation:  customer service  Social Needs   Financial resource strain: Not on file   Food insecurity    Worry: Not on file    Inability: Not on file   Transportation needs    Medical: Not on file    Non-medical: Not on file  Tobacco Use   Smoking status: Current Every Day Smoker    Packs/day: 1.00    Types: Cigarettes   Smokeless tobacco: Never Used  Substance and Sexual Activity   Alcohol use: Yes    Comment: 2 glasses of wine daily   Drug use: No   Sexual activity: Not Currently  Lifestyle   Physical activity    Days per week: Not on file    Minutes per session: Not on file   Stress: Not on file    Relationships   Social connections    Talks on phone: Not on file    Gets together: Not on file    Attends religious service: Not on file    Active member of club or organization: Not on file    Attends meetings of clubs or organizations: Not on file    Relationship status: Not on file  Other Topics Concern   Not on file  Social History Narrative   Lives at home with son.   Right-handed.   Occasional caffeine use.    Her Allergies Are:  No Known Allergies:   Her Current Medications Are:  Outpatient Encounter Medications as of 03/30/2019  Medication Sig   amLODipine (NORVASC) 10 MG tablet Take by mouth daily.    aspirin 81 MG chewable tablet Chew 1 tablet (81 mg total) by mouth daily.   BYSTOLIC 10 MG tablet TAKE 1 TABLET BY MOUTH EVERY DAY   cetirizine-pseudoephedrine (ZYRTEC-D) 5-120 MG tablet Take 1 tablet by mouth as needed.    HYDROCHLOROTHIAZIDE PO Take 25 mg by mouth daily.    nicotine (NICODERM CQ - DOSED IN MG/24 HOURS) 14 mg/24hr patch Place 1 patch (14 mg total) onto the skin daily.   spironolactone (ALDACTONE) 50 MG tablet Take 1 tablet (50 mg total) by mouth daily.   valsartan (DIOVAN) 160 MG tablet TAKE 1 TABLET BY MOUTH EVERY DAY   No facility-administered encounter medications on file as of 03/30/2019.   :   Review of Systems:  Out of a complete 14 point review of systems, all are reviewed and negative with the exception of these symptoms as listed below:  Observations/Objective: Her neck circumference by self-report is a little less than 13 inches, approximately 12.75 inches.  On examination, she is pleasant and conversant, in no acute distress, comprehension and language skills are good, speech is clear without dysarthria, hypophonia or voice tremor noted.  Hearing is grossly intact.  Face is symmetric with normal facial animation and good extraocular movements noted throughout.  Airway examination reveals mild mouth dryness, mildly crowded airway  noted with tonsils in place and slightly elongated uvula, Mallampati class II.  Tongue is on the wider side.  Tongue protrudes centrally in palate elevates symmetrically.  She has a minimal overbite.  Shoulder height is equal. Motor exam reveals normal-appearing muscle bulk, upper body movements and coordination grossly intact.  She walks without difficulty.  Assessment and Plan: Ms. Nygeria Lager is a 43 year old right-handed woman with an underlying medical history of asthma, DVT, hypertension, anxiety, depression, right-sided paresthesia, cardiomegaly and overweight state, who presents for a virtual, video based new patient visit via doxy.me in lieu of a face-to-face visit for evaluation of  an underlying organic sleep disorder, in particular, concern for obstructive sleep apnea. The patient's medical history and physical exam (albeit limited with current video-based evaluation) are concerning for a diagnosis of obstructive sleep apnea. I discussed with the patient the diagnosis of OSA, its prognosis and treatment options. I explained in particular the risks and ramifications of untreated moderate to severe OSA, especially with respect to developing cardiovascular disease down the Road, including congestive heart failure, difficult to treat hypertension, cardiac arrhythmias, or stroke. Even type 2 diabetes has, in part, been linked to untreated OSA. Symptoms of untreated OSA may include daytime sleepiness, memory problems, mood irritability and mood disorder such as depression and anxiety, lack of energy, as well as recurrent headaches, especially morning headaches. We talked about smoking cessation and the importance of weight control. We talked about the importance of maintaining good sleep hygiene. I recommended the following at this time: home sleep test.  I explained the sleep test procedure to the patient and also outlined possible treatment options of OSA, including the use of a custom-made dental  device (which would require a referral to a specialist dentist), upper airway surgical options, (such as UPPP, which would involve a referral to an ENT). I also explained the CPAP vs. AutoPAP treatment option to the patient, who indicated that she would be willing to try CPAP if the need arises. I answered all her questions today and the patient was in agreement. I plan to see the patient back after the sleep study is completed and encouraged her to call with any interim questions, concerns, problems or updates.   [name], MD, PhD    Follow Up Instructions:    I discussed the assessment and treatment plan with the patient. The patient was provided an opportunity to ask questions and all were answered. The patient agreed with the plan and demonstrated an understanding of the instructions.   The patient was advised to call back or seek an in-person evaluation if the symptoms worsen or if the condition fails to improve as anticipated.  I provided 30 minutes of non-face-to-face time during this encounter.   [name], MD

## 2019-03-30 NOTE — Patient Instructions (Signed)
Given verbally, during today's virtual video-based encounter, with verbal feedback received.   

## 2019-04-13 ENCOUNTER — Ambulatory Visit (INDEPENDENT_AMBULATORY_CARE_PROVIDER_SITE_OTHER): Payer: 59 | Admitting: Cardiology

## 2019-04-13 ENCOUNTER — Other Ambulatory Visit: Payer: Self-pay

## 2019-04-13 ENCOUNTER — Encounter: Payer: Self-pay | Admitting: Cardiology

## 2019-04-13 VITALS — BP 161/85 | HR 102 | Ht 64.0 in | Wt 162.0 lb

## 2019-04-13 DIAGNOSIS — R0789 Other chest pain: Secondary | ICD-10-CM | POA: Diagnosis not present

## 2019-04-13 DIAGNOSIS — F17200 Nicotine dependence, unspecified, uncomplicated: Secondary | ICD-10-CM | POA: Diagnosis not present

## 2019-04-13 DIAGNOSIS — R0609 Other forms of dyspnea: Secondary | ICD-10-CM

## 2019-04-13 DIAGNOSIS — I119 Hypertensive heart disease without heart failure: Secondary | ICD-10-CM | POA: Diagnosis not present

## 2019-04-13 MED ORDER — VALSARTAN 320 MG PO TABS: 320.0000 mg | ORAL_TABLET | Freq: Every day | ORAL | 1 refills | Status: DC

## 2019-04-13 MED ORDER — BYSTOLIC 20 MG PO TABS: 1.0000 | ORAL_TABLET | Freq: Every day | ORAL | 1 refills | Status: DC

## 2019-04-13 MED ORDER — NICOTINE 21 MG/24HR TD PT24: 21.0000 mg | MEDICATED_PATCH | Freq: Every day | TRANSDERMAL | 0 refills | Status: DC

## 2019-04-13 NOTE — Patient Instructions (Signed)
Increase bystolic to 20 mg daily  Increase Valsartan to 320 mg daily  Nicorette patches

## 2019-04-13 NOTE — Progress Notes (Signed)
Primary Physician:  Shanon Rosser, PA-C   Patient ID: Rachael Paul, female    DOB: 1976/04/14, 43 y.o.   MRN: [id number]  Subjective:    Chief Complaint  Patient presents with   Hypertension   Follow-up    HPI: Rachael Paul  is a 43 y.o. female  with ongoing tobacco use disorder, resistant hypertension, and recent questionable TIA/CVA in Jan 2020.  Patient had an episode of right sided parathesia in early Jan 2020. Underwent MRI at that time that did not reveal acute CVA. Blood pressure was markedly elevated at that time with systolic readings in the 916'B. By report, she had been out of her medication for 3 weeks. She is now on daily ASA. She has been evaluated by Neurology who felt that although her MRI of the brain was negative for acute CVA it could not rule out small vessel acute CVA. Her symptoms have since improved. She has had negative MRA of neck in March 2020. She has had hypercoaguable workup both by Neurology and Hematology in 2016, that she states was negative as she previously had a DVT in the remote past with pregnancy. Unfortunately, in May 2016 she lost her 44 year old son due to pulmonary embolism. Potentially related to paternal genetic mutation?  Underwent echocardiogram in May 2020 that revealed moderate LVH and grade 1 diastolic dysfunction. She was last seen 4 weeks ago, aldactone was increased to 50 mg and Bystolic was increased to 10 mg. She now presents for hypertension follow up. Blood pressure has improved some, but still has some occasional high readings. Also has noticed left sided headache in the afternoon.  She does have occasional episodes of chest discomfort that does not radiate to her arm and can happen both on exertion or just while sitting resting. She does have dyspnea on exertion that is stable.  Leg edema has improved since being on Aldactone.  Does mention snoring and states that she always wakes up tired, referral was placed for possible  sleep study at her last office visit and she was evaluated by Dr. Rexene Alberts and sleep study is pending.  No PND or orthopnea. No syncope. Occasional palpitations that occur at rest and last for a few seconds. Also mentions increased awareness of heart beat.    Since her last office visit, she has been able to cut back on tobacco use to half a pack per day.  No illicit drug use.  She works at [employer] as a Radiation protection practitioner. She has 3 living children and 1 deceased child. She does not exercise. Denies any family history of heart disease or sudden cardiac death.  Past Medical History:  Diagnosis Date   Anxiety and depression    Asthma    childhood   Bronchitis    DVT (deep venous thrombosis) (HCC)    Left Leg    Hypertension    Numbness     Past Surgical History:  Procedure Laterality Date   TUBAL LIGATION      Social History   Socioeconomic History   Marital status: Single    Spouse name: Not on file   Number of children: 4   Years of education: 12   Highest education level: High school graduate  Occupational History   Occupation: customer service  Social Needs   Financial resource strain: Not on file   Food insecurity    Worry: Not on file    Inability: Not on file   Transportation needs  Medical: Not on file    Non-medical: Not on file  Tobacco Use   Smoking status: Current Every Day Smoker    Packs/day: 0.50    Types: Cigarettes   Smokeless tobacco: Never Used  Substance and Sexual Activity   Alcohol use: Yes    Comment: 2 glasses of wine daily   Drug use: No   Sexual activity: Not Currently  Lifestyle   Physical activity    Days per week: Not on file    Minutes per session: Not on file   Stress: Not on file  Relationships   Social connections    Talks on phone: Not on file    Gets together: Not on file    Attends religious service: Not on file    Active member of club or organization: Not on file    Attends meetings of  clubs or organizations: Not on file    Relationship status: Not on file   Intimate partner violence    Fear of current or ex partner: Not on file    Emotionally abused: Not on file    Physically abused: Not on file    Forced sexual activity: Not on file  Other Topics Concern   Not on file  Social History Narrative   Lives at home with son.   Right-handed.   Occasional caffeine use.    Review of Systems  Constitution: Positive for malaise/fatigue. Negative for decreased appetite, weight gain and weight loss.  Eyes: Negative for visual disturbance.  Cardiovascular: Positive for chest pain and palpitations. Negative for claudication, dyspnea on exertion, leg swelling, orthopnea and syncope.  Respiratory: Positive for shortness of breath and snoring. Negative for hemoptysis and wheezing.   Endocrine: Negative for cold intolerance and heat intolerance.  Hematologic/Lymphatic: Does not bruise/bleed easily.  Skin: Negative for nail changes.  Musculoskeletal: Negative for muscle weakness and myalgias.  Gastrointestinal: Negative for abdominal pain, change in bowel habit, nausea and vomiting.  Neurological: Negative for difficulty with concentration, dizziness, focal weakness and headaches.  Psychiatric/Behavioral: Negative for altered mental status and suicidal ideas.  All other systems reviewed and are negative.     Objective:  Blood pressure (!) 161/85, pulse (!) 102, height _0  (1.626 m), weight 162 lb (73.5 kg), SpO2 97 %. Body mass index is 27.81 kg/m.    Physical Exam  Constitutional: She is oriented to person, place, and time. Vital signs are normal. She appears well-developed and well-nourished.  HENT:  Head: Normocephalic and atraumatic.  Neck: Normal range of motion. Neck supple. Carotid bruit is present.  Cardiovascular: Normal rate, regular rhythm and intact distal pulses.  Murmur heard.  Early systolic murmur is present with a grade of 1/6 at the upper right sternal  border. Pulses:      Carotid pulses are on the left side with bruit. Pulmonary/Chest: Effort normal and breath sounds normal. No accessory muscle usage. No respiratory distress.  Abdominal: Soft. Bowel sounds are normal.  Musculoskeletal: Normal range of motion.  Neurological: She is alert and oriented to person, place, and time.  Skin: Skin is warm and dry.  Vitals reviewed.  Radiology:  MRA of neck 11/02/2018: This is a normal MR angiogram of the intracranial arteries.  MRA of head 11/02/2018: This appears to be a normal MR angiogram of the intracranial arteries.     There is some movement artifact limiting interpretation.  However, there did not appear to be any hemodynamically significant regions of stenosis intracranial aneurysms.  US carotid artery  showed no large vessel disease. (Per Dr. Krista Blue note)  Laboratory examination:   Recent Labs: 01/03/2019: HgbA1c 4.9%. TSH 2.1. CBC normal. Creatinine 0.83, eGFR 86/100, potassium 4.2, CMP normal.   10/27/2018: mild elevated ESR 33, ANA was positive with mild scleroderma titer 1.1, hypercoagulation panels were negative.  CMP Latest Ref Rng & Units 02/23/2019 10/25/2018 04/06/2015  Glucose 65 - 99 mg/dL 87 113(H) 111(H)  BUN 6 - 24 mg/dL _0 Creatinine 0.57 - 1.00 mg/dL 0.90 0.72 0.71  Sodium 134 - 144 mmol/L 138 136 136  Potassium 3.5 - 5.2 mmol/L 4.7 3.7 3.6  Chloride 96 - 106 mmol/L 99 107 105  CO2 20 - 29 mmol/L _1 Calcium 8.7 - 10.2 mg/dL 9.6 8.7(L) 8.8(L)   CBC Latest Ref Rng & Units 10/25/2018 04/06/2015 07/16/2010  WBC 4.0 - 10.5 K/uL 4.5 4.5 7.1  Hemoglobin 12.0 - 15.0 g/dL 12.8 11.2(L) 12.0  Hematocrit 36.0 - 46.0 % 41.3 35.5(L) 36.6  Platelets 150 - 400 K/uL 234 274 253   Lipid Panel  No results found for: CHOL, TRIG, HDL, CHOLHDL, VLDL, LDLCALC, LDLDIRECT HEMOGLOBIN A1C Lab Results  Component Value Date   HGBA1C 5.0 04/05/2015   MPG 97 04/05/2015   TSH Recent Labs    10/27/18 1533  TSH 1.540     PRN Meds:. There are no discontinued medications. Current Meds  Medication Sig   amLODipine (NORVASC) 10 MG tablet Take by mouth daily.    aspirin 81 MG chewable tablet Chew 1 tablet (81 mg total) by mouth daily.   BYSTOLIC 10 MG tablet TAKE 1 TABLET BY MOUTH EVERY DAY   cetirizine-pseudoephedrine (ZYRTEC-D) 5-120 MG tablet Take 1 tablet by mouth as needed.    HYDROCHLOROTHIAZIDE PO Take 25 mg by mouth daily.    nicotine (NICODERM CQ - DOSED IN MG/24 HOURS) 14 mg/24hr patch Place 1 patch (14 mg total) onto the skin daily.   spironolactone (ALDACTONE) 50 MG tablet Take 1 tablet (50 mg total) by mouth daily.   valsartan (DIOVAN) 160 MG tablet TAKE 1 TABLET BY MOUTH EVERY DAY    Cardiac Studies:   Echocardiogram 03/07/2019: Normal LV systolic function with EF 56%. Left ventricle cavity is normal in size. Moderate concentric hypertrophy of the left ventricle with focal basal septal hypertrophy. No LVOT gradient noted.  Normal global wall motion. Doppler evidence of grade I (impaired) diastolic dysfunction, normal LAP. Calculated EF 56%. Left atrial cavity is mildly dilated. Mild tricuspid regurgitation. Estimated pulmonary artery systolic pressure 22 mmHg.  Assessment:     ICD-10-CM   1. Hypertensive heart disease without heart failure  I11.9   2. Dyspnea on exertion  R06.09 EKG 12-Lead  3. Atypical chest pain  R07.89 EKG 12-Lead  4. Tobacco use disorder  F17.200     EKG 04/13/2019: Normal sinus rhythm at 82 bpm, normal axis, LVH, non specific, diffuse T wave flattening.   Recommendations:   Patient with ongoing tobacco use disorder, resistant hypertension, and recent questionable TIA/CVA in Jan 2020.  Patient has had slight improvement in blood pressure with recent medication changes, tolerating medications well. Suspect possible underlying sleep apnea may also be contributing and has sleep study pending. Encouraged her to continue to work on diet and lifestyle changes  to also help with blood pressure control. Will increase Valsartan to 320 mg daily and Bystolic to 20 mg daily.   She does have atypical chest pain that has been stable, can happen both at rest or  with exertion. I have asked her to start daily walking for better assessment as she is mostly sedentary. She will need lexiscan nuclear stress test given her risk factors and symptoms, but will hold off until BP is better controlled. I am unsure of her lipids, will request from PCP for further risk stratification. Dyspnea on exertion likely related to hypertensive heart disease has remained stable.   She has been evaluated by Neurology for right sided paresthesia, small vessel TIA/CVA could not be excluded. Paresthesia has essentially resolved; however, continues to have occasional residual in her finger tips. She has been found to have positive ANA with mild elevation in scleroderma titer. Will defer to PCP for evaluation. No evidence of pulmonary hypertension by echo. She has had carotid duplex with neurology; however, do not have results. Will request for our records.   She has been able to cut back on tobacco use to half a pack, I have congratulated her on this and provided positive reinforcement. I have sent in prescription for nicotine patches to also help. I will see her back in 6 weeks for follow up on hypertension.     Miquel Dunn, MSN, APRN, FNP-C [facility]. [place] Office: [phone] Fax: [phone]

## 2019-04-26 ENCOUNTER — Other Ambulatory Visit: Payer: Self-pay

## 2019-04-26 ENCOUNTER — Ambulatory Visit (INDEPENDENT_AMBULATORY_CARE_PROVIDER_SITE_OTHER): Payer: 59 | Admitting: Neurology

## 2019-04-26 DIAGNOSIS — G4733 Obstructive sleep apnea (adult) (pediatric): Secondary | ICD-10-CM

## 2019-04-26 DIAGNOSIS — R351 Nocturia: Secondary | ICD-10-CM

## 2019-04-26 DIAGNOSIS — I119 Hypertensive heart disease without heart failure: Secondary | ICD-10-CM

## 2019-04-26 DIAGNOSIS — R519 Headache, unspecified: Secondary | ICD-10-CM

## 2019-04-26 DIAGNOSIS — R0683 Snoring: Secondary | ICD-10-CM

## 2019-04-26 DIAGNOSIS — I517 Cardiomegaly: Secondary | ICD-10-CM

## 2019-04-26 DIAGNOSIS — E663 Overweight: Secondary | ICD-10-CM

## 2019-04-28 ENCOUNTER — Other Ambulatory Visit: Payer: Self-pay | Admitting: Cardiology

## 2019-04-29 ENCOUNTER — Other Ambulatory Visit: Payer: Self-pay | Admitting: Cardiology

## 2019-05-03 ENCOUNTER — Telehealth: Payer: Self-pay

## 2019-05-03 NOTE — Telephone Encounter (Signed)
I called pt to discuss her sleep study results. No answer, left a message asking her to call me back. 

## 2019-05-03 NOTE — Addendum Note (Signed)
Addended by: [name] on: 05/03/2019 08:33 AM   Modules accepted: Orders

## 2019-05-03 NOTE — Telephone Encounter (Signed)
-----   Message from [name], MD sent at 05/03/2019  8:33 AM EDT ----- Patient referred by cardiology, seen by me on 03/30/19 in [facility], [place] on 04/26/19.    Please call and notify the patient that the recent home sleep test showed obstructive sleep apnea. OSA is overall mild, but worth treating to see if she feels better after treatment, also in light of her Hx of HTN and seeing cardio for cardiomegaly . To that end I recommend treatment for this in the form of autoPAP, which means, that we don't have to bring her in for a sleep study with CPAP, but will let her try an autoPAP machine at home, through a DME company (of her choice, or as per insurance requirement). The DME representative will educate her on how to use the machine, how to put the mask on, etc. I have placed an order in the chart. Please send referral, talk to patient, send report to referring MD. We will need a FU in sleep clinic for 10 weeks post-PAP set up, please arrange that with me or one of our NPs. Thanks,   [name], MD, PhD [facility])

## 2019-05-03 NOTE — Progress Notes (Signed)
cpap

## 2019-05-03 NOTE — Procedures (Signed)
Patient Information     First Name: Rachael Last Name: Paul ID: [id number]  Birth Date: 1976-06-24 Age: 43 Gender: Female  Referring Provider: Shanon Rosser, PA-C BMI: 27.9 (W=163 lb, H=5' 4'')  Neck Circ.:  13 '' Epworth:  4/24   Sleep Study Information    Study Date: Apr 26, 2019 S/H/A Version: 001.001.001.001 / 4.1.1528 / 31    History:      43 year old woman with a history of asthma, DVT, hypertension, anxiety, depression, right-sided paresthesia, cardiomegaly and overweight state, who reports snoring and morning headaches. Summary & Diagnosis:    Mild OSA   Recommendations:     This home sleep test demonstrates mild obstructive sleep apnea with a total AHI of 7.3/hour and O2 nadir of 91%. Given the patient's sleep related complaints, treatment with positive airway pressure is a reasonable consideration and can be achieved in the form of autoPAP titration/trial at home. Please note, that untreated obstructive sleep apnea may carry additional perioperative morbidity. Patients with significant obstructive sleep apnea should receive perioperative PAP therapy and the surgeons and particularly the anesthesiologist should be informed of the diagnosis and the severity of the sleep disordered breathing. Patient will be reminded regarding compliance with her PAP machine and to be mindful of cleanliness with the equipment and timely with supply changes (i.e. changing filter, mask, hose, humidifier chamber on an ongoing basis as recommended, and cleaning parts that touch the face and nose daily, etc). The patient should be cautioned not to drive, work at heights, or operate dangerous or heavy equipment when tired or sleepy. Review and reiteration of good sleep hygiene measures should be pursued with any patient. OSA treatment options for mild OSA otherwise include weight loss and avoidance of the supine sleep position or a dental device. These different avenues will be discussed with the patient as well.  Other causes of the patient's symptoms, including circadian rhythm disturbances, an underlying mood disorder, medication effect and/or an underlying medical problem cannot be ruled out based on this test. Clinical correlation is recommended. The patient and her referring provider will be notified of the test results. The patient will be seen in follow up in sleep clinic at [facility], either for a face-to-face or virtual visit, whichever feasible and recommended at the time.  I certify that I have reviewed the raw data recording prior to the issuance of this report in accordance with the standards of the American Academy of Sleep Medicine (AASM).  [name], MD, PhD [facility]) Diplomat, ABPN (Neurology and Sleep)              Start Study Time: End Study Time: Total Recording Time:  11:12:55 PM   6:31:52 AM   7 h, 18 min  Total Sleep Time % REM of Sleep Time:  4 h, 50 min  27.4    Mean: 97 Minimum: 91 Maximum: 100  Mean of Desaturations Nadirs (%):  94  Oxygen Desaturation. %: 4-9 10-20 >20 Total  Events Number Total  9 100.0 0 0  0.0 0.0  9 100.0  Oxygen Saturation: <90 <=88  <85 <80 <70  Duration (minutes): Sleep % 0.0 0.0 0.0 0.0  0.0 0.0  0.0 0.0 0.0 0.0     Respiratory Indices       Total Events REM NREM All Night  pRDI: pAHI: ODI:  36  35  9 10.7 9.9 1.5 6.4 6.4 2.0 7.6 7.3 1.9       Pulse Rate Statistics  during Sleep (BPM)      Mean: 95 Minimum: 79 Maximum:  118    Oxygen Saturation Statistics   Sleep Summary   Indices are calculated using technically valid sleep time of  4 hrs, 46 min. pRDI/pAHI are calculated using oxi desaturations ? 3%                Sleep Stages Chart   * Reference values are according to AASM guidelines

## 2019-05-03 NOTE — Telephone Encounter (Signed)
I called pt. I advised pt that Dr. Rexene Alberts reviewed their sleep study results and found that pt has mild osa. Dr. Rexene Alberts recommends that pt start an auto pap at home. I reviewed PAP compliance expectations with the pt. Pt is agreeable to starting an auto-PAP. I advised pt that an order will be sent to a DME, [facility], and [facility] will call the pt within about one week after they file with the pt's insurance. [facility] will show the pt how to use the machine, fit for masks, and troubleshoot the auto-PAP if needed. A follow up appt was made for insurance purposes with Amy, NP on 07/31/19 at 8:00am. Pt verbalized understanding to arrive 15 minutes early and bring their auto-PAP. A letter with all of this information in it will be mailed to the pt as a reminder. I verified with the pt that the address we have on file is correct. Pt verbalized understanding of results. Pt had no questions at this time but was encouraged to call back if questions arise. I have sent the order to [employer] and have received confirmation that they have received the order.

## 2019-05-03 NOTE — Progress Notes (Signed)
Patient referred by cardiology, seen by me on 03/30/19 in [facility], [place] on 04/26/19.    Please call and notify the patient that the recent home sleep test showed obstructive sleep apnea. OSA is overall mild, but worth treating to see if she feels better after treatment, also in light of her Hx of HTN and seeing cardio for cardiomegaly . To that end I recommend treatment for this in the form of autoPAP, which means, that we don't have to bring her in for a sleep study with CPAP, but will let her try an autoPAP machine at home, through a DME company (of her choice, or as per insurance requirement). The DME representative will educate her on how to use the machine, how to put the mask on, etc. I have placed an order in the chart. Please send referral, talk to patient, send report to referring MD. We will need a FU in sleep clinic for 10 weeks post-PAP set up, please arrange that with me or one of our NPs. Thanks,   [name], MD, PhD [facility])

## 2019-05-03 NOTE — Telephone Encounter (Signed)
Pt returning call please call back °

## 2019-05-26 ENCOUNTER — Ambulatory Visit: Payer: 59 | Admitting: Cardiology

## 2019-06-01 ENCOUNTER — Telehealth: Payer: 59 | Admitting: Cardiology

## 2019-06-01 NOTE — Progress Notes (Deleted)
Primary Physician:  Shanon Rosser, PA-C   Patient ID: Rachael Paul, female    DOB: 1975/10/17, 43 y.o.   MRN: [id number]  Subjective:    No chief complaint on file.   HPI: Rachael Paul  is a 43 y.o. female  with ongoing tobacco use disorder, resistant hypertension, and recent questionable TIA/CVA in Jan 2020.  Patient had an episode of right sided parathesia in early Jan 2020. Underwent MRI at that time that did not reveal acute CVA. Blood pressure was markedly elevated at that time with systolic readings in the 287'O. By report, she had been out of her medication for 3 weeks. She is now on daily ASA. She has been evaluated by Neurology who felt that although her MRI of the brain was negative for acute CVA it could not rule out small vessel acute CVA. Her symptoms have since improved. She has had negative MRA of neck in March 2020. She has had hypercoaguable workup both by Neurology and Hematology in 2016, that she states was negative as she previously had a DVT in the remote past with pregnancy. Unfortunately, in May 2016 she lost her 31 year old son due to pulmonary embolism. Potentially related to paternal genetic mutation?  Underwent echocardiogram in May 2020 that revealed moderate LVH and grade 1 diastolic dysfunction. She was last seen 4 weeks ago, aldactone was increased to 50 mg and Bystolic was increased to 10 mg. She now presents for hypertension follow up. Blood pressure has improved some, but still has some occasional high readings. Also has noticed left sided headache in the afternoon.  She does have occasional episodes of chest discomfort that does not radiate to her arm and can happen both on exertion or just while sitting resting. She does have dyspnea on exertion that is stable.  Leg edema has improved since being on Aldactone.  Does mention snoring and states that she always wakes up tired, referral was placed for possible sleep study at her last office visit and she was  evaluated by Dr. Rexene Alberts and sleep study is pending.  No PND or orthopnea. No syncope. Occasional palpitations that occur at rest and last for a few seconds. Also mentions increased awareness of heart beat.    Since her last office visit, she has been able to cut back on tobacco use to half a pack per day.  No illicit drug use.  She works at [employer] as a Radiation protection practitioner. She has 3 living children and 1 deceased child. She does not exercise. Denies any family history of heart disease or sudden cardiac death.  Past Medical History:  Diagnosis Date  . Anxiety and depression   . Asthma    childhood  . Bronchitis   . DVT (deep venous thrombosis) (HCC)    Left Leg   . Hypertension   . Numbness     Past Surgical History:  Procedure Laterality Date  . TUBAL LIGATION      Social History   Socioeconomic History  . Marital status: Single    Spouse name: Not on file  . Number of children: 4  . Years of education: 42  . Highest education level: High school graduate  Occupational History  . Occupation: Therapist, art  Social Needs  . Financial resource strain: Not on file  . Food insecurity    Worry: Not on file    Inability: Not on file  . Transportation needs    Medical: Not on file    Non-medical:  Not on file  Tobacco Use  . Smoking status: Current Every Day Smoker    Packs/day: 0.50    Types: Cigarettes  . Smokeless tobacco: Never Used  Substance and Sexual Activity  . Alcohol use: Yes    Comment: 2 glasses of wine daily  . Drug use: No  . Sexual activity: Not Currently  Lifestyle  . Physical activity    Days per week: Not on file    Minutes per session: Not on file  . Stress: Not on file  Relationships  . Social Herbalist on phone: Not on file    Gets together: Not on file    Attends religious service: Not on file    Active member of club or organization: Not on file    Attends meetings of clubs or organizations: Not on file     Relationship status: Not on file  . Intimate partner violence    Fear of current or ex partner: Not on file    Emotionally abused: Not on file    Physically abused: Not on file    Forced sexual activity: Not on file  Other Topics Concern  . Not on file  Social History Narrative   Lives at home with son.   Right-handed.   Occasional caffeine use.    Review of Systems  Constitution: Positive for malaise/fatigue. Negative for decreased appetite, weight gain and weight loss.  Eyes: Negative for visual disturbance.  Cardiovascular: Positive for chest pain and palpitations. Negative for claudication, dyspnea on exertion, leg swelling, orthopnea and syncope.  Respiratory: Positive for shortness of breath and snoring. Negative for hemoptysis and wheezing.   Endocrine: Negative for cold intolerance and heat intolerance.  Hematologic/Lymphatic: Does not bruise/bleed easily.  Skin: Negative for nail changes.  Musculoskeletal: Negative for muscle weakness and myalgias.  Gastrointestinal: Negative for abdominal pain, change in bowel habit, nausea and vomiting.  Neurological: Negative for difficulty with concentration, dizziness, focal weakness and headaches.  Psychiatric/Behavioral: Negative for altered mental status and suicidal ideas.  All other systems reviewed and are negative.     Objective:  There were no vitals taken for this visit. There is no height or weight on file to calculate BMI.    Physical Exam  Constitutional: She is oriented to person, place, and time. Vital signs are normal. She appears well-developed and well-nourished.  HENT:  Head: Normocephalic and atraumatic.  Neck: Normal range of motion. Neck supple. Carotid bruit is present.  Cardiovascular: Normal rate, regular rhythm and intact distal pulses.  Murmur heard.  Early systolic murmur is present with a grade of 1/6 at the upper right sternal border. Pulses:      Carotid pulses are on the left side with bruit.  Pulmonary/Chest: Effort normal and breath sounds normal. No accessory muscle usage. No respiratory distress.  Abdominal: Soft. Bowel sounds are normal.  Musculoskeletal: Normal range of motion.  Neurological: She is alert and oriented to person, place, and time.  Skin: Skin is warm and dry.  Vitals reviewed.  Radiology:  MRA of neck 11/02/2018: This is a normal MR angiogram of the intracranial arteries.  MRA of head 11/02/2018: This appears to be a normal MR angiogram of the intracranial arteries.     There is some movement artifact limiting interpretation.  However, there did not appear to be any hemodynamically significant regions of stenosis intracranial aneurysms.  US carotid artery showed no large vessel disease. (Per Dr. Krista Blue note)  Laboratory examination:   Recent  Labs: 01/03/2019: HgbA1c 4.9%. TSH 2.1. CBC normal. Creatinine 0.83, eGFR 86/100, potassium 4.2, CMP normal.   10/27/2018: mild elevated ESR 33, ANA was positive with mild scleroderma titer 1.1, hypercoagulation panels were negative.  CMP Latest Ref Rng & Units 02/23/2019 10/25/2018 04/06/2015  Glucose 65 - 99 mg/dL 87 113(H) 111(H)  BUN 6 - 24 mg/dL '20 10 12  '$ Creatinine 0.57 - 1.00 mg/dL 0.90 0.72 0.71  Sodium 134 - 144 mmol/L 138 136 136  Potassium 3.5 - 5.2 mmol/L 4.7 3.7 3.6  Chloride 96 - 106 mmol/L 99 107 105  CO2 20 - 29 mmol/L '21 22 23  '$ Calcium 8.7 - 10.2 mg/dL 9.6 8.7(L) 8.8(L)   CBC Latest Ref Rng & Units 10/25/2018 04/06/2015 07/16/2010  WBC 4.0 - 10.5 K/uL 4.5 4.5 7.1  Hemoglobin 12.0 - 15.0 g/dL 12.8 11.2(L) 12.0  Hematocrit 36.0 - 46.0 % 41.3 35.5(L) 36.6  Platelets 150 - 400 K/uL 234 274 253   Lipid Panel  No results found for: CHOL, TRIG, HDL, CHOLHDL, VLDL, LDLCALC, LDLDIRECT HEMOGLOBIN A1C Lab Results  Component Value Date   HGBA1C 5.0 04/05/2015   MPG 97 04/05/2015   TSH Recent Labs    10/27/18 1533  TSH 1.540    PRN Meds:. There are no discontinued medications. No outpatient  medications have been marked as taking for the 06/01/19 encounter (Appointment) with Miquel Dunn, NP.    Cardiac Studies:   Echocardiogram 03/07/2019: Normal LV systolic function with EF 56%. Left ventricle cavity is normal in size. Moderate concentric hypertrophy of the left ventricle with focal basal septal hypertrophy. No LVOT gradient noted.  Normal global wall motion. Doppler evidence of grade I (impaired) diastolic dysfunction, normal LAP. Calculated EF 56%. Left atrial cavity is mildly dilated. Mild tricuspid regurgitation. Estimated pulmonary artery systolic pressure 22 mmHg.  Assessment:   No diagnosis found.  EKG 04/13/2019: Normal sinus rhythm at 82 bpm, normal axis, LVH, non specific, diffuse T wave flattening.   Recommendations:   Patient with ongoing tobacco use disorder, resistant hypertension, and recent questionable TIA/CVA in Jan 2020.  Patient has had slight improvement in blood pressure with recent medication changes, tolerating medications well. Suspect possible underlying sleep apnea may also be contributing and has sleep study pending. Encouraged her to continue to work on diet and lifestyle changes to also help with blood pressure control. Will increase Valsartan to 320 mg daily and Bystolic to 20 mg daily.   She does have atypical chest pain that has been stable, can happen both at rest or with exertion. I have asked her to start daily walking for better assessment as she is mostly sedentary. She will need lexiscan nuclear stress test given her risk factors and symptoms, but will hold off until BP is better controlled. I am unsure of her lipids, will request from PCP for further risk stratification. Dyspnea on exertion likely related to hypertensive heart disease has remained stable.   She has been evaluated by Neurology for right sided paresthesia, small vessel TIA/CVA could not be excluded. Paresthesia has essentially resolved; however, continues to have  occasional residual in her finger tips. She has been found to have positive ANA with mild elevation in scleroderma titer. Will defer to PCP for evaluation. No evidence of pulmonary hypertension by echo. She has had carotid duplex with neurology; however, do not have results. Will request for our records.   She has been able to cut back on tobacco use to half a pack, I have congratulated  her on this and provided positive reinforcement. I have sent in prescription for nicotine patches to also help. I will see her back in 6 weeks for follow up on hypertension.     Miquel Dunn, MSN, APRN, FNP-C [facility]. [place] Office: [phone] Fax: [phone]

## 2019-06-22 ENCOUNTER — Other Ambulatory Visit: Payer: Self-pay

## 2019-06-22 DIAGNOSIS — Z20822 Contact with and (suspected) exposure to covid-19: Secondary | ICD-10-CM

## 2019-06-23 LAB — NOVEL CORONAVIRUS, NAA: SARS-CoV-2, NAA: NOT DETECTED

## 2019-07-30 NOTE — Progress Notes (Deleted)
PATIENT: Rachael Paul DOB: 09-23-1976  REASON FOR VISIT: follow up HISTORY FROM: patient  No chief complaint on file.    HISTORY OF PRESENT ILLNESS: Today 07/30/19 Rachael Paul is a 43 y.o. female here today for follow up of mild OSA on CPAP.   HISTORY: (copied from Dr Guadelupe Sabin note on 03/30/2019)  Rachael Paul is a 43 year old right-handed woman with an underlying medical history of asthma, DVT, hypertension, anxiety, depression, right-sided paresthesia, cardiomegaly and overweight state, who presents for a virtual, video based appointment via [email or web address] for evaluation of her sleep disorder, in particular, concern for underlying obstructive sleep apnea.  The patient is unaccompanied today and joins via cell phone from home, I am located in my office.  She is referred by cardiology, Lenn Cal, nurse practitioner with Dr. Irven Shelling office and I reviewed her office note from 03/16/2019.  The patient's Epworth sleepiness score is 4 out of 24, fatigue severity score is 41 out of 63. She reports snoring and occasional morning headaches, she has woken up with a sense of gasping for air.  She denies a family history of OSA.  She has some trouble falling asleep.  She tries to be in bed around 10, sleeps with the TV on at night, reports that she cannot fall asleep in a quiet or dark room.  She works from home, she works in Therapist, art.  She is a smoker, smokes about 1 pack/day or less.  She drinks no caffeine daily, she drinks alcohol daily in the form of wine, 1 to 2 glasses/day.  She has a variable rise time, typically she is up by 4 or 5 AM.  She does not wake up fully rested.  She has occasional morning headaches.  She has nocturia about once per average night.  She lives with her 2 children.  She has no pets in the household.  Her weight has been fluctuating a little bit, most recent weight by self-report is 163 pounds, neck circumference by self-report is 12 and three-quarter inches.   REVIEW OF SYSTEMS: Out of a complete 14 system review of symptoms, the patient complains only of the following symptoms, and all other reviewed systems are negative.  ALLERGIES: No Known Allergies  HOME MEDICATIONS: Outpatient Medications Prior to Visit  Medication Sig Dispense Refill  . amLODipine (NORVASC) 10 MG tablet Take by mouth daily.     Marland Kitchen aspirin 81 MG chewable tablet Chew 1 tablet (81 mg total) by mouth daily. 30 tablet 0  . BYSTOLIC 20 MG TABS TAKE 1 TABLET BY MOUTH EVERY DAY 90 tablet 1  . cetirizine-pseudoephedrine (ZYRTEC-D) 5-120 MG tablet Take 1 tablet by mouth as needed.     Marland Kitchen HYDROCHLOROTHIAZIDE PO Take 25 mg by mouth daily.     . nicotine (NICODERM CQ - DOSED IN MG/24 HOURS) 21 mg/24hr patch Place 1 patch (21 mg total) onto the skin daily. 28 patch 0  . spironolactone (ALDACTONE) 50 MG tablet Take 1 tablet (50 mg total) by mouth daily. 30 tablet 3  . valsartan (DIOVAN) 320 MG tablet TAKE 1 TABLET BY MOUTH EVERY DAY 90 tablet 0   No facility-administered medications prior to visit.     PAST MEDICAL HISTORY: Past Medical History:  Diagnosis Date  . Anxiety and depression   . Asthma    childhood  . Bronchitis   . DVT (deep venous thrombosis) (HCC)    Left Leg   . Hypertension   . Numbness     PAST  SURGICAL HISTORY: Past Surgical History:  Procedure Laterality Date  . TUBAL LIGATION      FAMILY HISTORY: Family History  Problem Relation Age of Onset  . Other Mother        drug related death  . Diabetes Mother   . Other Father        unsure of history  . Hypertension Maternal Grandmother   . Diabetes Maternal Grandmother   . Diabetes Maternal Grandfather   . Dementia Maternal Grandfather   . Hypertension Maternal Grandfather   . Pulmonary embolism Son     SOCIAL HISTORY: Social History   Socioeconomic History  . Marital status: Single    Spouse name: Not on file  . Number of children: 4  . Years of education: 21  . Highest education  level: High school graduate  Occupational History  . Occupation: Therapist, art  Social Needs  . Financial resource strain: Not on file  . Food insecurity    Worry: Not on file    Inability: Not on file  . Transportation needs    Medical: Not on file    Non-medical: Not on file  Tobacco Use  . Smoking status: Current Every Day Smoker    Packs/day: 0.50    Types: Cigarettes  . Smokeless tobacco: Never Used  Substance and Sexual Activity  . Alcohol use: Yes    Comment: 2 glasses of wine daily  . Drug use: No  . Sexual activity: Not Currently  Lifestyle  . Physical activity    Days per week: Not on file    Minutes per session: Not on file  . Stress: Not on file  Relationships  . Social Herbalist on phone: Not on file    Gets together: Not on file    Attends religious service: Not on file    Active member of club or organization: Not on file    Attends meetings of clubs or organizations: Not on file    Relationship status: Not on file  . Intimate partner violence    Fear of current or ex partner: Not on file    Emotionally abused: Not on file    Physically abused: Not on file    Forced sexual activity: Not on file  Other Topics Concern  . Not on file  Social History Narrative   Lives at home with son.   Right-handed.   Occasional caffeine use.      PHYSICAL EXAM  There were no vitals filed for this visit. There is no height or weight on file to calculate BMI.  Generalized: Well developed, in no acute distress  Cardiology: normal rate and rhythm, no murmur noted Respiratory: Clear to auscultation bilaterally Mallampati:  Neurological examination  Mentation: Alert oriented to time, place, history taking. Follows all commands speech and language fluent Cranial nerve II-XII: Pupils were equal round reactive to light. Extraocular movements were full, visual field were full on confrontational test. Facial sensation and strength were normal. Uvula tongue  midline. Head turning and shoulder shrug  were normal and symmetric. Motor: The motor testing reveals 5 over 5 strength of all 4 extremities. Good symmetric motor tone is noted throughout.  Sensory: Sensory testing is intact to soft touch on all 4 extremities. No evidence of extinction is noted.  Coordination: Cerebellar testing reveals good finger-nose-finger and heel-to-shin bilaterally.  Gait and station: Gait is normal.   DIAGNOSTIC DATA (LABS, IMAGING, TESTING) - I reviewed patient records, labs, notes, testing and  imaging myself where available.  No flowsheet data found.   Lab Results  Component Value Date   WBC 4.5 10/25/2018   HGB 12.8 10/25/2018   HCT 41.3 10/25/2018   MCV 89.0 10/25/2018   PLT 234 10/25/2018      Component Value Date/Time   NA 138 02/23/2019 1133   K 4.7 02/23/2019 1133   CL 99 02/23/2019 1133   CO2 21 02/23/2019 1133   GLUCOSE 87 02/23/2019 1133   GLUCOSE 113 (H) 10/25/2018 1135   BUN 20 02/23/2019 1133   CREATININE 0.90 02/23/2019 1133   CALCIUM 9.6 02/23/2019 1133   GFRNONAA 79 02/23/2019 1133   GFRAA 91 02/23/2019 1133   No results found for: CHOL, HDL, LDLCALC, LDLDIRECT, TRIG, CHOLHDL Lab Results  Component Value Date   HGBA1C 5.0 04/05/2015   Lab Results  Component Value Date   [id number] 10/27/2018   Lab Results  Component Value Date   TSH 1.540 10/27/2018       ASSESSMENT AND PLAN 43 y.o. year old female  has a past medical history of Anxiety and depression, Asthma, Bronchitis, DVT (deep venous thrombosis) ([place]), Hypertension, and Numbness. here with ***    ICD-10-CM   1. OSA on CPAP  G47.33    Z99.89   2. Cardiomegaly  I51.7   3. Essential hypertension  I10      Compliance report reveals excellent compliance.  She was encouraged to continue using CPAP nightly and for greater than 4 hours each night.  She will follow-up in 1 year, sooner if needed.  She verbalizes understanding and agreement with this plan.  No  orders of the defined types were placed in this encounter.    No orders of the defined types were placed in this encounter.     I spent 15 minutes with the patient. 50% of this time was spent counseling and educating patient on plan of care and medications.    Debbora Presto, FNP-C 07/30/2019, 7:56 PM [facility] [address] [place], [place] [address] [phone]

## 2019-07-31 ENCOUNTER — Ambulatory Visit: Payer: Self-pay | Admitting: Family Medicine

## 2019-07-31 ENCOUNTER — Encounter: Payer: Self-pay | Admitting: Family Medicine

## 2019-07-31 ENCOUNTER — Telehealth: Payer: Self-pay

## 2019-07-31 NOTE — Telephone Encounter (Signed)
Patient was a no call/no show for their appointment today.   

## 2020-10-01 ENCOUNTER — Other Ambulatory Visit: Payer: Self-pay | Admitting: Physician Assistant

## 2020-10-01 DIAGNOSIS — N63 Unspecified lump in unspecified breast: Secondary | ICD-10-CM

## 2020-10-02 ENCOUNTER — Other Ambulatory Visit: Payer: Self-pay | Admitting: Physician Assistant

## 2020-10-02 DIAGNOSIS — N6452 Nipple discharge: Secondary | ICD-10-CM

## 2020-10-02 DIAGNOSIS — N63 Unspecified lump in unspecified breast: Secondary | ICD-10-CM

## 2020-11-11 ENCOUNTER — Other Ambulatory Visit: Payer: Self-pay | Admitting: Obstetrics and Gynecology

## 2020-11-11 DIAGNOSIS — R1032 Left lower quadrant pain: Secondary | ICD-10-CM

## 2020-11-15 ENCOUNTER — Other Ambulatory Visit: Payer: Self-pay

## 2020-11-15 ENCOUNTER — Ambulatory Visit
Admission: RE | Admit: 2020-11-15 | Discharge: 2020-11-15 | Disposition: A | Discharge status: Home / Self Care | Payer: 59 | Source: Ambulatory Visit | Attending: Physician Assistant | Admitting: Physician Assistant

## 2020-11-15 ENCOUNTER — Other Ambulatory Visit: Payer: Self-pay | Admitting: Physician Assistant

## 2020-11-15 DIAGNOSIS — N63 Unspecified lump in unspecified breast: Secondary | ICD-10-CM

## 2020-11-15 DIAGNOSIS — N6452 Nipple discharge: Secondary | ICD-10-CM

## 2020-11-22 ENCOUNTER — Ambulatory Visit
Admission: RE | Admit: 2020-11-22 | Discharge: 2020-11-22 | Disposition: A | Discharge status: Home / Self Care | Payer: Managed Care, Other (non HMO) | Source: Ambulatory Visit | Attending: Obstetrics and Gynecology | Admitting: Obstetrics and Gynecology

## 2020-11-22 DIAGNOSIS — R1032 Left lower quadrant pain: Secondary | ICD-10-CM

## 2020-11-27 ENCOUNTER — Ambulatory Visit
Admission: RE | Admit: 2020-11-27 | Discharge: 2020-11-27 | Disposition: A | Discharge status: Home / Self Care | Payer: Managed Care, Other (non HMO) | Source: Ambulatory Visit | Attending: Physician Assistant | Admitting: Physician Assistant

## 2020-11-27 ENCOUNTER — Other Ambulatory Visit: Payer: Self-pay

## 2020-11-27 ENCOUNTER — Other Ambulatory Visit: Payer: Self-pay | Admitting: General Practice

## 2020-11-27 DIAGNOSIS — N6452 Nipple discharge: Secondary | ICD-10-CM

## 2020-11-27 DIAGNOSIS — N63 Unspecified lump in unspecified breast: Secondary | ICD-10-CM

## 2021-01-08 ENCOUNTER — Encounter: Payer: Self-pay | Admitting: Internal Medicine

## 2021-01-31 ENCOUNTER — Other Ambulatory Visit: Payer: Self-pay | Admitting: Surgery

## 2021-02-07 ENCOUNTER — Other Ambulatory Visit: Payer: Self-pay

## 2021-02-07 ENCOUNTER — Ambulatory Visit (AMBULATORY_SURGERY_CENTER): Payer: Managed Care, Other (non HMO) | Admitting: *Deleted

## 2021-02-07 VITALS — Ht 64.0 in | Wt 170.0 lb

## 2021-02-07 DIAGNOSIS — Z1211 Encounter for screening for malignant neoplasm of colon: Secondary | ICD-10-CM

## 2021-02-07 MED ORDER — NA SULFATE-K SULFATE-MG SULF 17.5-3.13-1.6 GM/177ML PO SOLN: ORAL | 0 refills | Status: DC

## 2021-02-07 NOTE — Progress Notes (Signed)
Patient's pre-visit was done today over the phone with the patient due to COVID-19 pandemic. Name,DOB and address verified. Insurance verified. Patient denies any allergies to Eggs and Soy. Patient denies any problems with anesthesia/sedation. Patient denies taking diet pills or blood thinners. Packet of Prep instructions mailed to patient including a copy of a consent form-pt is aware. Prep instructions sent to mychart-pt aware. Patient understands to call us back with any questions or concerns. The patient is COVID-19 fully vaccinated, per patient. Patient is aware of our care-partner policy and [id number] safety protocol. EMMI education assigned to the patient for the procedure, sent to [place]. Pt states she is not taking any medications, she is out of BP meds-I encouraged her to call her PCP to get refills. She states she will do that.

## 2021-02-13 ENCOUNTER — Encounter: Payer: Self-pay | Admitting: Internal Medicine

## 2021-02-14 ENCOUNTER — Other Ambulatory Visit: Payer: Self-pay

## 2021-02-14 ENCOUNTER — Encounter: Payer: Self-pay | Admitting: Internal Medicine

## 2021-02-14 ENCOUNTER — Ambulatory Visit: Payer: Managed Care, Other (non HMO) | Admitting: Internal Medicine

## 2021-02-14 VITALS — BP 199/110 | HR 94 | Temp 98.2°F | Ht 64.0 in | Wt 170.0 lb

## 2021-02-14 MED ORDER — SODIUM CHLORIDE 0.9 % IV SOLN: 500.0000 mL | Freq: Once | INTRAVENOUS | Status: DC

## 2021-02-14 NOTE — Progress Notes (Signed)
Patient presented to the [facility] for screening colonoscopy On admission she was found to have elevated blood pressure, 199/110 She reports having been off of her blood pressure medications for the last week. She reports her pressure was even higher at home before she arrived on her home cuff it was 322 systolic. She is having a headache, no visual changes.  I recommended against colonoscopy today given hypertension which is uncontrolled.  My concern would be that she would have relative hypotension with sedation which could put her at risk for cardiovascular event. I apologize for the inconvenience but feel that it is very important for her to get her hypertension control prior to screening colonoscopy.  We will reschedule colonoscopy for approximately 2 months to allow time for control of hypertension We can try to provide a sample prep given that she already purchased one for today's procedure  If her headache worsens or should she develop any other neurologic symptoms I recommend she go immediately to the ER She will try to be seen by her primary care provider today

## 2021-02-14 NOTE — Progress Notes (Signed)
Patient's bp was extremely elevated today, 199/110, and she stated that she has not taken any bp medication for 6 months.  Dr Hilarie Fredrickson spoke to patient and advised to see her primary MD today to get back on medication.  Pt agreed and procedure was canceled.  Colonoscopy re-scheduled for 05/09/21 and pt given Plenvu prep.

## 2021-05-07 ENCOUNTER — Encounter: Payer: Self-pay | Admitting: Cardiology

## 2021-05-07 ENCOUNTER — Other Ambulatory Visit: Payer: Self-pay

## 2021-05-07 ENCOUNTER — Ambulatory Visit: Payer: 59 | Admitting: Cardiology

## 2021-05-07 VITALS — BP 141/94 | HR 77 | Temp 97.9°F | Resp 16 | Ht 64.0 in | Wt 172.8 lb

## 2021-05-07 DIAGNOSIS — Z9119 Patient's noncompliance with other medical treatment and regimen: Secondary | ICD-10-CM

## 2021-05-07 DIAGNOSIS — Z86718 Personal history of other venous thrombosis and embolism: Secondary | ICD-10-CM

## 2021-05-07 DIAGNOSIS — I10 Essential (primary) hypertension: Secondary | ICD-10-CM

## 2021-05-07 DIAGNOSIS — R002 Palpitations: Secondary | ICD-10-CM

## 2021-05-07 DIAGNOSIS — R072 Precordial pain: Secondary | ICD-10-CM

## 2021-05-07 DIAGNOSIS — Z91199 Patient's noncompliance with other medical treatment and regimen due to unspecified reason: Secondary | ICD-10-CM

## 2021-05-07 DIAGNOSIS — F17200 Nicotine dependence, unspecified, uncomplicated: Secondary | ICD-10-CM

## 2021-05-07 MED ORDER — VALSARTAN 320 MG PO TABS: 320.0000 mg | ORAL_TABLET | Freq: Every day | ORAL | 0 refills | Status: DC

## 2021-05-07 NOTE — Progress Notes (Signed)
Date:  05/07/2021   ID:  Rachael Paul, DOB September 07, 1976, MRN [id number]  PCP:  Shanon Rosser, PA-C  Cardiologist:  Rex Kras, DO, [facility] (established care 05/07/2021) Former Cardiology Providers: Jeri Lager, APRN, FNP-C   Chief Complaint  Patient presents with   Palpitations   Hypertension    HPI  Rachael Paul is a 45 y.o. female who presents to the office with a chief complaint of " evaluation of chest pain and palpitations." Patient's past medical history and cardiovascular risk factors include: Tobacco use, hypertension, DVT associated with pregnancy, history of TIA January 2020.  She is referred to the office at the request of Long, Nicki Reaper, PA-C for evaluation of hypertension and chest pain/palpitations.  Patient was seen by Miquel Dunn back in 2020 and now presents to the office to reestablish care.  I am seeing her for the first time to resume care.  Based on last office encounter patient was noted to have right-sided paresthesias in January 2020 and underwent additional work-up and was noted to have TIA and was started on aspirin 81 mg p.o. daily.  She has undergone hypercoagulable work-up in the past by neurology/hematology in 2016 and based on her memory work-up was negative.  She has a history of a DVT associated with her prior pregnancy.  She has undergone an echocardiogram results reviewed and noted below for further reference.  Patient states that during the COVID-19 pandemic she decided to come off of her antihypertensive medications as they were too many to take.  She has been off of her blood pressure pills for at least 1 year and recently been restarted on them by her primary care provider.  Chest pain: Patient states that the chest pain has been going on for the last 1 year, occurs on a weekly, left-side of the anterior chest wall, nonradiating, improves with taking a deep breath, worse with laying flat, intermittent, at times occurs with effort related activities but  not always.  Usually self-limited.  Palpitations: She is been having the symptoms for the last couple years, occurs at least on a monthly basis, lasting for a few minutes, no improving or worsening factors, self-limited, denies any near-syncope or syncopal events.  Patient occasionally drinks coffee.  No consumption of sodas, energy drinks, weight loss supplements, herbal supplements.  No known thyroid disease and has had a history of anemia the most recent CBC within normal limits.  Unfortunately, patient continues to smoke 1 pack/day.  She is motivated to quit but has had difficult time in the past.  Prior to restarting her antihypertensive medications her systolic blood pressures would be around 200 mmHg.  With the current medical regimen she states that her home blood pressures are between 170-180 mmHg.  She has also been evaluated for sleep apnea in the past and was noted to have mild OSA per patient and was prescribed a CPAP but she does not use it on a regular basis.  FUNCTIONAL STATUS: No structured exercise program or daily routine.   ALLERGIES: No Known Allergies  MEDICATION LIST PRIOR TO VISIT: Current Meds  Medication Sig   aspirin 81 MG chewable tablet Chew 1 tablet (81 mg total) by mouth daily.   cetirizine-pseudoephedrine (ZYRTEC-D) 5-120 MG tablet Take 1 tablet by mouth as needed.   nebivolol (BYSTOLIC) 10 MG tablet Take 10 mg by mouth daily.   spironolactone (ALDACTONE) 25 MG tablet Take 25 mg by mouth daily.   valsartan (DIOVAN) 320 MG tablet Take 1 tablet (320 mg total) by  mouth daily at 10 pm.   [DISCONTINUED] BYSTOLIC 20 MG TABS TAKE 1 TABLET BY MOUTH EVERY DAY   [DISCONTINUED] spironolactone (ALDACTONE) 50 MG tablet Take 1 tablet (50 mg total) by mouth daily.   [DISCONTINUED] valsartan (DIOVAN) 160 MG tablet Take 160 mg by mouth daily.   [DISCONTINUED] valsartan (DIOVAN) 320 MG tablet TAKE 1 TABLET BY MOUTH EVERY DAY (Patient taking differently: Take 160 mg by mouth  daily.)     PAST MEDICAL HISTORY: Past Medical History:  Diagnosis Date   Anxiety and depression    Asthma    childhood   Bronchitis    DVT (deep venous thrombosis) (HCC)    Left Leg    Hypertension    Numbness    Sleep apnea    does not use CPAP   TIA (transient ischemic attack) 2020    PAST SURGICAL HISTORY: Past Surgical History:  Procedure Laterality Date   TUBAL LIGATION     WRIST SURGERY      FAMILY HISTORY: The patient family history includes Dementia in her maternal grandfather; Diabetes in her maternal grandfather, maternal grandmother, and mother; Hypertension in her maternal grandfather and maternal grandmother; Other in her father and mother; Pulmonary embolism in her son.  SOCIAL HISTORY:  The patient  reports that she has been smoking cigarettes. She has been smoking an average of 1 pack per day. She has never used smokeless tobacco. She reports current alcohol use of about 14.0 standard drinks of alcohol per week. She reports that she does not use drugs.  REVIEW OF SYSTEMS: Review of Systems  Constitutional: Negative for chills and fever.  HENT:  Negative for hoarse voice and nosebleeds.   Eyes:  Negative for discharge, double vision and pain.  Cardiovascular:  Positive for chest pain and palpitations. Negative for claudication, dyspnea on exertion, leg swelling, near-syncope, orthopnea, paroxysmal nocturnal dyspnea and syncope.  Respiratory:  Negative for hemoptysis and shortness of breath.   Musculoskeletal:  Negative for muscle cramps and myalgias.  Gastrointestinal:  Negative for abdominal pain, constipation, diarrhea, hematemesis, hematochezia, melena, nausea and vomiting.  Neurological:  Negative for dizziness and light-headedness.   PHYSICAL EXAM: Vitals with BMI 05/07/2021 05/07/2021 02/14/2021  Height - '5\' 4"'$  '5\' 4"'$   Weight - 172 lbs 13 oz 170 lbs  BMI - [id number]  Systolic [id number]  Diastolic 94 A999333 [id number]  Pulse 77 59 94  Some encounter  information is confidential and restricted. Go to Review Flowsheets activity to see all data.    CONSTITUTIONAL: Well-developed and well-nourished. No acute distress.  SKIN: Skin is warm and dry. No rash noted. No cyanosis. No pallor. No jaundice HEAD: Normocephalic and atraumatic.  EYES: No scleral icterus MOUTH/THROAT: Moist oral membranes.  NECK: No JVD present. No thyromegaly noted. No carotid bruits  LYMPHATIC: No visible cervical adenopathy.  CHEST Normal respiratory effort. No intercostal retractions  LUNGS: Clear to auscultation bilaterally.  No stridor. No wheezes. No rales.  CARDIOVASCULAR: Regular rate and rhythm, positive S1-S2, no murmurs rubs or gallops appreciated ABDOMINAL: soft, nontender, nondistended, positive bowel sounds in all 4 quadrants, no apparent ascites.  EXTREMITIES: No peripheral edema  HEMATOLOGIC: No significant bruising NEUROLOGIC: Oriented to person, place, and time. Nonfocal. Normal muscle tone.  PSYCHIATRIC: Normal mood and affect. Normal behavior. Cooperative  RADIOLOGY: MRA of neck 11/02/2018: This is a normal MR angiogram of the intracranial arteries.   MRA of head 11/02/2018: This appears to be a normal MR angiogram of the intracranial arteries.  There is some movement artifact limiting interpretation.  However, there did not appear to be any hemodynamically significant regions of stenosis intracranial aneurysms.   US carotid artery showed no large vessel disease. (Per Dr. Krista Blue note)  CARDIAC DATABASE: EKG: 05/07/2021: Normal sinus rhythm, 73 bpm, normal axis, left atrial enlargement, LVH per voltage criteria, nonspecific T wave abnormality.  Echocardiogram: 03/07/2019: Normal LV systolic function with EF 56%. Left ventricle cavity is normal in size. Moderate concentric hypertrophy of the left ventricle with focal basal septal hypertrophy. No LVOT gradient noted.  Normal global wall motion. Doppler evidence of grade I (impaired) diastolic  dysfunction, normal LAP. Calculated EF 56%. Left atrial cavity is mildly dilated. Mild tricuspid regurgitation. Estimated pulmonary artery systolic pressure 22 mmHg.  Stress Testing: No results found for this or any previous visit from the past 1095 days.  Heart Catheterization: None  LABORATORY DATA: CBC Latest Ref Rng & Units 10/25/2018 04/06/2015 07/16/2010  WBC 4.0 - 10.5 K/uL 4.5 4.5 7.1  Hemoglobin 12.0 - 15.0 g/dL 12.8 11.2(L) 12.0  Hematocrit 36.0 - 46.0 % 41.3 35.5(L) 36.6  Platelets 150 - 400 K/uL 234 274 253    CMP Latest Ref Rng & Units 02/23/2019 10/25/2018 04/06/2015  Glucose 65 - 99 mg/dL 87 113(H) 111(H)  BUN 6 - 24 mg/dL '20 10 12  '$ Creatinine 0.57 - 1.00 mg/dL 0.90 0.72 0.71  Sodium 134 - 144 mmol/L 138 136 136  Potassium 3.5 - 5.2 mmol/L 4.7 3.7 3.6  Chloride 96 - 106 mmol/L 99 107 105  CO2 20 - 29 mmol/L '21 22 23  '$ Calcium 8.7 - 10.2 mg/dL 9.6 8.7(L) 8.8(L)    Lipid Panel  No results found for: CHOL, TRIG, HDL, CHOLHDL, VLDL, LDLCALC, LDLDIRECT, LABVLDL  No components found for: NTPROBNP No results for input(s): PROBNP in the last 8760 hours. No results for input(s): TSH in the last 8760 hours.  BMP No results for input(s): NA, K, CL, CO2, GLUCOSE, BUN, CREATININE, CALCIUM, GFRNONAA, GFRAA in the last 8760 hours.  HEMOGLOBIN A1C Lab Results  Component Value Date   HGBA1C 5.0 04/05/2015   MPG 97 04/05/2015    Labs provided by primary care 04/11/2021: Hemoglobin 14.7 g/dL, hematocrit 44.4% BUN 13 Creatinine 0.62 mg/dL. Sodium 139, potassium 5.1, chloride 104, bicarb 22 AST 17, ALT 10, alkaline phosphatase 82 Total cholesterol 162, triglycerides 68, HDL 81, LDL 68. Hemoglobin A1c 5.2. TSH 1.79.  IMPRESSION:    ICD-10-CM   1. Palpitations  R00.2 EKG 12-Lead    2. Precordial pain  R07.2     3. Benign hypertension  I10 PCV ECHOCARDIOGRAM COMPLETE    valsartan (DIOVAN) 320 MG tablet    LONG TERM MONITOR (3-14 DAYS)    Basic metabolic panel     Magnesium    4. Smoking  F17.200     5. History of DVT (deep vein thrombosis)  Z86.718     6. Non-compliance  Z91.19        RECOMMENDATIONS: Rachael Paul is a 44 y.o. female whose past medical history and cardiac risk factors include: Tobacco use, hypertension, DVT associated with pregnancy, history of TIA January 2020.  Benign essential hypertension: Last seen back in 2020 by Jeri Lager, APRN, FNP-C and during the COVID-19 pandemic she chose to come off all her antihypertensive medications. Recently followed up with her primary care provider and her antihypertensive medications have been started. Home blood pressures used to be greater than or equal to 200 mmHg when she was not on medications  and since restarting them SBP ranges between 170-180 mmHg. We called the patient's pharmacy during the office visit to confirm her medications. Will increase valsartan to 320 mg p.o. every afternoon Blood work in 1 week to evaluate kidney function and electrolytes Patient is educated on the importance of blood pressure management because uncontrolled hypertension can cause progressive CKD leading to ESRD, cardiomyopathy, TIA/CVA, etc. Patient is motivated to have her blood pressures well controlled after our discussion today. Low-salt diet recommended. Recommend 30 minutes of moderate intensity exercise as tolerated 5 days a week. Patient is also encouraged to use her CPAP given her underlying OSA.  Precordial chest pain: Echocardiogram will be ordered to evaluate for structural heart disease and left ventricular systolic function. Will order a stress test once her blood pressure is well controlled to prevent false positives. Patient is agreeable with the plan of care EKG notes normal sinus rhythm without underlying injury pattern.  Palpitations: 14-day extended Holter monitor to evaluate for dysrhythmias. TSH within normal limits. No identifiable reversible cause Monitor for  now  Smoking: Tobacco cessation counseling: Currently smoking 1 packs/day   Patient was informed of the dangers of tobacco abuse including stroke, cancer, and MI, as well as benefits of tobacco cessation. Patient is willing to quit at this time. Approximately 7 mins were spent counseling patient cessation techniques. We discussed various methods to help quit smoking, including deciding on a date to quit, joining a support group, pharmacological agents- nicotine gum/patch/lozenges, chantix.  I will reassess her progress at the next follow-up visit   FINAL MEDICATION LIST END OF ENCOUNTER: Meds ordered this encounter  Medications   valsartan (DIOVAN) 320 MG tablet    Sig: Take 1 tablet (320 mg total) by mouth daily at 10 pm.    Dispense:  30 tablet    Refill:  0    Current Outpatient Medications:    aspirin 81 MG chewable tablet, Chew 1 tablet (81 mg total) by mouth daily., Disp: 30 tablet, Rfl: 0   cetirizine-pseudoephedrine (ZYRTEC-D) 5-120 MG tablet, Take 1 tablet by mouth as needed., Disp: , Rfl:    nebivolol (BYSTOLIC) 10 MG tablet, Take 10 mg by mouth daily., Disp: , Rfl:    spironolactone (ALDACTONE) 25 MG tablet, Take 25 mg by mouth daily., Disp: , Rfl:    valsartan (DIOVAN) 320 MG tablet, Take 1 tablet (320 mg total) by mouth daily at 10 pm., Disp: 30 tablet, Rfl: 0  Orders Placed This Encounter  Procedures   Basic metabolic panel   Magnesium   LONG TERM MONITOR (3-14 DAYS)   EKG 12-Lead   PCV ECHOCARDIOGRAM COMPLETE    There are no Patient Instructions on file for this visit.   --Continue cardiac medications as reconciled in final medication list. --Return in about 6 weeks (around 06/18/2021) for Follow up, BP, Chest pain, Palpitations, Review test results. Or sooner if needed. --Continue follow-up with your primary care physician regarding the management of your other chronic comorbid conditions.  Patient's questions and concerns were addressed to her satisfaction.  She voices understanding of the instructions provided during this encounter.   This note was created using a voice recognition software as a result there may be grammatical errors inadvertently enclosed that do not reflect the nature of this encounter. Every attempt is made to correct such errors.  Rex Kras, [place], [facility]  Pager: [phone] Office: [phone]

## 2021-05-09 ENCOUNTER — Encounter: Payer: Managed Care, Other (non HMO) | Admitting: Internal Medicine

## 2021-05-13 ENCOUNTER — Other Ambulatory Visit: Payer: Self-pay

## 2021-05-13 DIAGNOSIS — I10 Essential (primary) hypertension: Secondary | ICD-10-CM

## 2021-05-14 ENCOUNTER — Ambulatory Visit: Payer: Managed Care, Other (non HMO)

## 2021-05-14 ENCOUNTER — Inpatient Hospital Stay: Payer: Managed Care, Other (non HMO)

## 2021-05-14 ENCOUNTER — Other Ambulatory Visit: Payer: Self-pay

## 2021-05-14 DIAGNOSIS — I10 Essential (primary) hypertension: Secondary | ICD-10-CM

## 2021-06-07 LAB — BASIC METABOLIC PANEL
BUN/Creatinine Ratio: 18 (ref 9–23)
BUN: 16 mg/dL (ref 6–24)
CO2: 20 mmol/L (ref 20–29)
Calcium: 9 mg/dL (ref 8.7–10.2)
Chloride: 103 mmol/L (ref 96–106)
Creatinine, Ser: 0.88 mg/dL (ref 0.57–1.00)
Glucose: 88 mg/dL (ref 65–99)
Potassium: 4.6 mmol/L (ref 3.5–5.2)
Sodium: 139 mmol/L (ref 134–144)
eGFR: 83 mL/min/{1.73_m2} (ref >59)

## 2021-06-07 LAB — MAGNESIUM: Magnesium: 1.9 mg/dL (ref 1.6–2.3)

## 2021-06-20 ENCOUNTER — Ambulatory Visit: Payer: Managed Care, Other (non HMO) | Admitting: Cardiology

## 2021-06-20 ENCOUNTER — Encounter: Payer: Self-pay | Admitting: Cardiology

## 2021-06-20 ENCOUNTER — Other Ambulatory Visit: Payer: Self-pay | Admitting: Cardiology

## 2021-06-20 ENCOUNTER — Other Ambulatory Visit: Payer: Self-pay

## 2021-06-20 VITALS — BP 162/82 | HR 73 | Temp 97.6°F | Resp 16 | Ht 64.0 in | Wt 176.0 lb

## 2021-06-20 DIAGNOSIS — R002 Palpitations: Secondary | ICD-10-CM

## 2021-06-20 DIAGNOSIS — Z86718 Personal history of other venous thrombosis and embolism: Secondary | ICD-10-CM

## 2021-06-20 DIAGNOSIS — F17200 Nicotine dependence, unspecified, uncomplicated: Secondary | ICD-10-CM

## 2021-06-20 DIAGNOSIS — R072 Precordial pain: Secondary | ICD-10-CM

## 2021-06-20 DIAGNOSIS — I10 Essential (primary) hypertension: Secondary | ICD-10-CM

## 2021-06-20 MED ORDER — CHLORTHALIDONE 25 MG PO TABS
25.0000 mg | ORAL_TABLET | Freq: Every morning | ORAL | 0 refills | Status: DC
Start: 2021-06-20 — End: 2021-06-23

## 2021-06-20 NOTE — Progress Notes (Signed)
Date:  06/20/2021   ID:  Rachael Paul, DOB 09-21-1976, MRN [id number]  PCP:  Shanon Rosser, PA-C  Cardiologist:  Rex Kras, DO, [facility] (established care 05/07/2021) Former Cardiology Providers: Jeri Lager, APRN, FNP-C  Date: 06/20/21 Last Office Visit: 05/07/2021  Chief Complaint  Patient presents with   Hypertension   Results   Follow-up    Reevaluation of chest pain and palpitations    HPI  Rachael Paul is a 45 y.o. female who presents to the office with a chief complaint of " evaluation of chest pain and palpitations." Patient's past medical history and cardiovascular risk factors include: Tobacco use, hypertension, DVT associated with pregnancy, history of TIA January 2020.  She is referred to the office at the request of Long, Nicki Reaper, PA-C for evaluation of hypertension and chest pain/palpitations.  During the COVID-19 pandemic patient decided to come off of all her antihypertensive medications and was not taking any pharmacological agents for at least 1 year.  She recently has seen her primary care provider and was encouraged to restart her antihypertensive medications and referred to cardiology for further evaluation and management.  Prior to establishing care her SBP would be around 200 mmHg and now her SBP ranges between 135-170 mmHg.  She tolerated the higher dose of valsartan well and repeat blood work notes stable kidney function and electrolytes.  Patient states that she is in the process of getting new equipment for her CPAP but currently has not been utilizing it.  Given her symptoms of palpitation she also underwent an extended Holter monitor which noted no significant dysrhythmias.  With regards to her chest pain she continues to have intermittent discomfort over the anterior chest wall, intensity 3 out of 10, lasting for a few minutes, brought on by effort related activities and does not resolve with rest.  Unfortunately she continues to smoke less than 1  pack/day.  FUNCTIONAL STATUS: No structured exercise program or daily routine.   ALLERGIES: No Known Allergies  MEDICATION LIST PRIOR TO VISIT: Current Meds  Medication Sig   aspirin 81 MG chewable tablet Chew 1 tablet (81 mg total) by mouth daily.   cetirizine-pseudoephedrine (ZYRTEC-D) 5-120 MG tablet Take 1 tablet by mouth as needed.   chlorthalidone (HYGROTON) 25 MG tablet Take 1 tablet (25 mg total) by mouth every morning.   nebivolol (BYSTOLIC) 10 MG tablet Take 10 mg by mouth daily.   spironolactone (ALDACTONE) 25 MG tablet Take 25 mg by mouth daily.   valsartan (DIOVAN) 320 MG tablet Take 1 tablet (320 mg total) by mouth daily at 10 pm.     PAST MEDICAL HISTORY: Past Medical History:  Diagnosis Date   Anxiety and depression    Asthma    childhood   Bronchitis    DVT (deep venous thrombosis) (HCC)    Left Leg    Hypertension    Numbness    Sleep apnea    does not use CPAP   TIA (transient ischemic attack) 2020    PAST SURGICAL HISTORY: Past Surgical History:  Procedure Laterality Date   TUBAL LIGATION     WRIST SURGERY      FAMILY HISTORY: The patient family history includes Dementia in her maternal grandfather; Diabetes in her maternal grandfather, maternal grandmother, and mother; Hypertension in her maternal grandfather and maternal grandmother; Other in her father and mother; Pulmonary embolism in her son.  SOCIAL HISTORY:  The patient  reports that she has been smoking cigarettes. She has been smoking an average  of 1 pack per day. She has never used smokeless tobacco. She reports current alcohol use of about 14.0 standard drinks per week. She reports that she does not use drugs.  REVIEW OF SYSTEMS: Review of Systems  Constitutional: Negative for chills and fever.  HENT:  Negative for hoarse voice and nosebleeds.   Eyes:  Positive for pain. Negative for discharge and double vision.  Cardiovascular:  Positive for chest pain. Negative for claudication,  dyspnea on exertion, leg swelling, near-syncope, orthopnea, palpitations, paroxysmal nocturnal dyspnea and syncope.  Respiratory:  Negative for hemoptysis and shortness of breath.   Musculoskeletal:  Negative for muscle cramps and myalgias.  Gastrointestinal:  Negative for abdominal pain, constipation, diarrhea, hematemesis, hematochezia, melena, nausea and vomiting.  Neurological:  Negative for dizziness and light-headedness.   PHYSICAL EXAM: Vitals with BMI 06/20/2021 06/20/2021 05/07/2021  Height - '5\' 4"'$  -  Weight - 176 lbs -  BMI - 123XX123 -  Systolic 0000000 0000000 [id number]  Diastolic 82 92 94  Pulse 73 77 77  Some encounter information is confidential and restricted. Go to Review Flowsheets activity to see all data.    CONSTITUTIONAL: Well-developed and well-nourished. No acute distress.  SKIN: Skin is warm and dry. No rash noted. No cyanosis. No pallor. No jaundice HEAD: Normocephalic and atraumatic.  EYES: No scleral icterus MOUTH/THROAT: Moist oral membranes.  NECK: No JVD present. No thyromegaly noted. No carotid bruits  LYMPHATIC: No visible cervical adenopathy.  CHEST Normal respiratory effort. No intercostal retractions  LUNGS: Clear to auscultation bilaterally.  No stridor. No wheezes. No rales.  CARDIOVASCULAR: Regular rate and rhythm, positive S1-S2, no murmurs rubs or gallops appreciated ABDOMINAL: soft, nontender, nondistended, positive bowel sounds in all 4 quadrants, no apparent ascites.  EXTREMITIES: No peripheral edema  HEMATOLOGIC: No significant bruising NEUROLOGIC: Oriented to person, place, and time. Nonfocal. Normal muscle tone.  PSYCHIATRIC: Normal mood and affect. Normal behavior. Cooperative  RADIOLOGY: MRA of neck 11/02/2018: This is a normal MR angiogram of the intracranial arteries.   MRA of head 11/02/2018: This appears to be a normal MR angiogram of the intracranial arteries.     There is some movement artifact limiting interpretation.  However, there did not appear  to be any hemodynamically significant regions of stenosis intracranial aneurysms.   US carotid artery showed no large vessel disease. (Per Dr. Krista Blue note)  CARDIAC DATABASE: EKG: 05/07/2021: Normal sinus rhythm, 73 bpm, normal axis, left atrial enlargement, LVH per voltage criteria, nonspecific T wave abnormality.  Echocardiogram: 05/14/2021: Normal LV systolic function with visual EF 55-60%. Left ventricle cavity is normal in size. Presence of a septal bulge. Moderate to severe concentric hypertrophy of the left ventricle. Normal global wall motion. Doppler evidence of grade II (pseudonormal) diastolic dysfunction, elevated LAP. Left atrial cavity is mildly dilated. Mild tricuspid regurgitation. No evidence of pulmonary hypertension. Compared to study 03/07/2019: Grade I DD is now Grade 2 and LAP is now elevated otherwise no significant change.  Stress Testing: No results found for this or any previous visit from the past 1095 days.  Heart Catheterization: None  14 day extended Holter monitor: Patch Wear Time: 12 days and 9 hours  Dominant rhythm normal sinus, followed by sinus tachycardia (burden 5%). Heart rate 64-117 bpm. Avg HR 88 bpm. No atrial fibrillation, supraventricular or ventricular tachycardia, high grade AV block, pauses (3 seconds or longer). Total ventricular ectopic burden <1%. Total supraventricular ectopic burden <1%. Patient triggered events: 4. These did not correlate with any significant dysrhythmias.  LABORATORY DATA: CBC Latest Ref Rng & Units 10/25/2018 04/06/2015 07/16/2010  WBC 4.0 - 10.5 K/uL 4.5 4.5 7.1  Hemoglobin 12.0 - 15.0 g/dL 12.8 11.2(L) 12.0  Hematocrit 36.0 - 46.0 % 41.3 35.5(L) 36.6  Platelets 150 - 400 K/uL 234 274 253    CMP Latest Ref Rng & Units 06/06/2021 02/23/2019 10/25/2018  Glucose 65 - 99 mg/dL 88 87 113(H)  BUN 6 - 24 mg/dL '16 20 10  '$ Creatinine 0.57 - 1.00 mg/dL 0.88 0.90 0.72  Sodium 134 - 144 mmol/L 139 138 136  Potassium 3.5 -  5.2 mmol/L 4.6 4.7 3.7  Chloride 96 - 106 mmol/L 103 99 107  CO2 20 - 29 mmol/L '20 21 22  '$ Calcium 8.7 - 10.2 mg/dL 9.0 9.6 8.7(L)    Lipid Panel  No results found for: CHOL, TRIG, HDL, CHOLHDL, VLDL, LDLCALC, LDLDIRECT, LABVLDL  No components found for: NTPROBNP No results for input(s): PROBNP in the last 8760 hours. No results for input(s): TSH in the last 8760 hours.  BMP Recent Labs    06/06/21 1253  NA 139  K 4.6  CL 103  CO2 20  GLUCOSE 88  BUN 16  CREATININE 0.88  CALCIUM 9.0    HEMOGLOBIN A1C Lab Results  Component Value Date   HGBA1C 5.0 04/05/2015   MPG 97 04/05/2015    Labs provided by primary care 04/11/2021: Hemoglobin 14.7 g/dL, hematocrit 44.4% BUN 13 Creatinine 0.62 mg/dL. Sodium 139, potassium 5.1, chloride 104, bicarb 22 AST 17, ALT 10, alkaline phosphatase 82 Total cholesterol 162, triglycerides 68, HDL 81, LDL 68. Hemoglobin A1c 5.2. TSH 1.79.  IMPRESSION:    ICD-10-CM   1. Benign hypertension  I10 chlorthalidone (HYGROTON) 25 MG tablet    PCV RENAL/RENAL ARTERY DUPLEX COMPLETE    Basic metabolic panel    Magnesium    2. Palpitations  R00.2     3. Precordial pain  R07.2     4. Smoking  F17.200     5. History of DVT (deep vein thrombosis)  Z86.718        RECOMMENDATIONS: Rachael Paul is a 46 y.o. female whose past medical history and cardiac risk factors include: Tobacco use, hypertension, DVT associated with pregnancy, history of TIA January 2020.  Benign essential hypertension: Patient stopped taking her antihypertensive medications during the COVID-19 pandemic and started having blood pressure readings up to 200 mmHg.  Her antihypertensive medications have been slowly uptitrated and her home systolic blood pressures now range between 150-170 mmHg. Will add chlorthalidone 25 mg p.o. every morning as her recent echocardiogram notes grade 2 diastolic impairment and elevated left atrial pressure. Patient is educated on the  importance of having labs done in 1 week to evaluate her kidney function and electrolytes. Ideally I would like her systolic blood pressures to be between 120-130 mmHg if able to tolerate. She is highly encouraged to start using her CPAP machine given her underlying sleep apnea. Will check renal duplex to rule out renal artery stenosis..  Precordial chest pain: Patient continues to have symptoms of chest discomfort which are not entirely cardiac in etiology  However, given her risk factors would recommend an ischemic evaluation once her blood pressure is better controlled.   Recent echocardiogram noted prominent septal bulge with moderate to severe concentric hypertrophy.  Recommended to check cardiac MRI to evaluate for hypertrophic cardiomyopathy, infiltrative disease process and ischemia.  Shared decision was to considering at the next office visit.  Palpitations: 14-day extended Holter monitor results  reviewed, no significant dysrhythmias. Ventricular rate is well controlled. TSH within normal limits. No identifiable reversible cause Monitor for now  Smoking: Tobacco cessation counseling: Currently smoking <1 packs/day   Patient was informed of the dangers of tobacco abuse including stroke, cancer, and MI, as well as benefits of tobacco cessation. Patient is willing to quit at this time. 5 mins were spent counseling patient cessation techniques. We discussed various methods to help quit smoking, including deciding on a date to quit, joining a support group, pharmacological agents- nicotine gum/patch/lozenges.  I will reassess her progress at the next follow-up visit   FINAL MEDICATION LIST END OF ENCOUNTER: Meds ordered this encounter  Medications   chlorthalidone (HYGROTON) 25 MG tablet    Sig: Take 1 tablet (25 mg total) by mouth every morning.    Dispense:  30 tablet    Refill:  0    Current Outpatient Medications:    aspirin 81 MG chewable tablet, Chew 1 tablet (81 mg  total) by mouth daily., Disp: 30 tablet, Rfl: 0   cetirizine-pseudoephedrine (ZYRTEC-D) 5-120 MG tablet, Take 1 tablet by mouth as needed., Disp: , Rfl:    chlorthalidone (HYGROTON) 25 MG tablet, Take 1 tablet (25 mg total) by mouth every morning., Disp: 30 tablet, Rfl: 0   nebivolol (BYSTOLIC) 10 MG tablet, Take 10 mg by mouth daily., Disp: , Rfl:    spironolactone (ALDACTONE) 25 MG tablet, Take 25 mg by mouth daily., Disp: , Rfl:    valsartan (DIOVAN) 320 MG tablet, Take 1 tablet (320 mg total) by mouth daily at 10 pm., Disp: 30 tablet, Rfl: 0  Orders Placed This Encounter  Procedures   Basic metabolic panel   Magnesium   PCV RENAL/RENAL ARTERY DUPLEX COMPLETE    There are no Patient Instructions on file for this visit.   --Continue cardiac medications as reconciled in final medication list. --Return in about 3 weeks (around 07/11/2021) for Follow up, BP. Or sooner if needed. --Continue follow-up with your primary care physician regarding the management of your other chronic comorbid conditions.  Patient's questions and concerns were addressed to her satisfaction. She voices understanding of the instructions provided during this encounter.   This note was created using a voice recognition software as a result there may be grammatical errors inadvertently enclosed that do not reflect the nature of this encounter. Every attempt is made to correct such errors.  Rex Kras, [place], [facility]  Pager: [phone] Office: [phone]

## 2021-06-27 ENCOUNTER — Other Ambulatory Visit: Payer: Self-pay

## 2021-06-27 DIAGNOSIS — I10 Essential (primary) hypertension: Secondary | ICD-10-CM

## 2021-07-02 LAB — MAGNESIUM: Magnesium: 2.4 mg/dL — ABNORMAL HIGH (ref 1.6–2.3)

## 2021-07-02 LAB — BASIC METABOLIC PANEL
BUN/Creatinine Ratio: 31 — ABNORMAL HIGH (ref 9–23)
BUN: 31 mg/dL — ABNORMAL HIGH (ref 6–24)
CO2: 18 mmol/L — ABNORMAL LOW (ref 20–29)
Calcium: 9.4 mg/dL (ref 8.7–10.2)
Chloride: 98 mmol/L (ref 96–106)
Creatinine, Ser: 1.01 mg/dL — ABNORMAL HIGH (ref 0.57–1.00)
Glucose: 86 mg/dL (ref 65–99)
Potassium: 4.8 mmol/L (ref 3.5–5.2)
Sodium: 134 mmol/L (ref 134–144)
eGFR: 70 mL/min/{1.73_m2} (ref >59)

## 2021-07-08 ENCOUNTER — Other Ambulatory Visit: Payer: Managed Care, Other (non HMO)

## 2021-07-16 ENCOUNTER — Other Ambulatory Visit: Payer: Managed Care, Other (non HMO)

## 2021-07-21 ENCOUNTER — Ambulatory Visit (AMBULATORY_SURGERY_CENTER): Payer: Managed Care, Other (non HMO)

## 2021-07-21 ENCOUNTER — Encounter: Payer: Self-pay | Admitting: Internal Medicine

## 2021-07-21 VITALS — Ht 64.0 in | Wt 170.0 lb

## 2021-07-21 DIAGNOSIS — Z1211 Encounter for screening for malignant neoplasm of colon: Secondary | ICD-10-CM

## 2021-07-21 MED ORDER — PLENVU 140 G PO SOLR: 1.0000 | Freq: Once | ORAL | 0 refills | Status: AC

## 2021-07-21 NOTE — Progress Notes (Signed)
No egg or soy allergy known to patient  No issues known to pt with past sedation with any surgeries or procedures Patient denies ever being told they had issues or difficulty with intubation  No FH of Malignant Hyperthermia Pt is not on diet pills Pt is not on  home 02  Pt is not on blood thinners   Pt denies issues with constipation, except one episode recently, instructed pt to take miralax one capful daily for 7 days before the procedure and dulcolax laxative prn if constipated in the future.   No A fib or A flutter  Virtual previsit, pt had to change appt to 08/22/21.  Pt has plenvu sample from 02/14/21 date of canceled procedure due to high BP, pt states BP is much better and she is taking her medication as ordered.   Due to the COVID-19 pandemic we are asking patients to follow certain guidelines in [place] and the [place]   Pt aware of COVID protocols and [facility] guidelines

## 2021-07-25 ENCOUNTER — Other Ambulatory Visit: Payer: Managed Care, Other (non HMO)

## 2021-07-25 ENCOUNTER — Ambulatory Visit: Payer: Managed Care, Other (non HMO) | Admitting: Cardiology

## 2021-07-25 ENCOUNTER — Ambulatory Visit: Payer: Managed Care, Other (non HMO)

## 2021-07-25 ENCOUNTER — Other Ambulatory Visit: Payer: Self-pay

## 2021-07-25 DIAGNOSIS — I10 Essential (primary) hypertension: Secondary | ICD-10-CM

## 2021-07-29 NOTE — Progress Notes (Signed)
Spoke to patient she is aware

## 2021-08-01 ENCOUNTER — Ambulatory Visit: Payer: Managed Care, Other (non HMO) | Admitting: Cardiology

## 2021-08-01 ENCOUNTER — Encounter: Payer: Self-pay | Admitting: Cardiology

## 2021-08-01 ENCOUNTER — Other Ambulatory Visit: Payer: Self-pay

## 2021-08-01 VITALS — BP 123/79 | HR 84 | Temp 97.6°F | Resp 16 | Ht 64.0 in | Wt 178.4 lb

## 2021-08-01 DIAGNOSIS — R002 Palpitations: Secondary | ICD-10-CM

## 2021-08-01 DIAGNOSIS — I10 Essential (primary) hypertension: Secondary | ICD-10-CM

## 2021-08-01 DIAGNOSIS — R072 Precordial pain: Secondary | ICD-10-CM

## 2021-08-01 DIAGNOSIS — F17200 Nicotine dependence, unspecified, uncomplicated: Secondary | ICD-10-CM

## 2021-08-01 NOTE — Progress Notes (Signed)
ID:  Rachael Paul, DOB 17-Jan-1976, MRN [id number]  PCP:  Shanon Rosser, PA-C  Cardiologist:  Rex Kras, DO, [facility] (established care 05/07/2021) Former Cardiology Providers: Jeri Lager, APRN, FNP-C  Date: 08/01/2021 Last Office Visit: 06/20/2021   Chief Complaint  Patient presents with   Hypertension   Follow-up    HPI  Rachael Paul is a 45 y.o. female who presents to the office with a chief complaint of " blood pressure management." Patient's past medical history and cardiovascular risk factors include: Tobacco use, hypertension, DVT associated with pregnancy, history of TIA January 2020.  She is referred to the office at the request of Long, Nicki Reaper, PA-C for evaluation of hypertension and chest pain/palpitations.  During the pandemic patient decided to discontinue all her antihypertensive medications and was off of her medical therapy for at least 1 year.  After seeing her PCP she was encouraged to follow-up with cardiology for evaluation and management of her blood pressure and chest pain.  Prior to establishing care patient is SBP would range around 200 mmHg with up titration of medical therapy her blood pressure now ranges between 120-130 mmHg.  Patient is also in the process of getting new equipment for CPAP.  She had renal artery duplex since last visit which notes possible renal artery stenosis.  She continues to have chest discomfort, intermittent, anterior chest wall, intensity 3 out of 10, lasting for a few minutes, not brought on by effort related activities, and did not resolve with rest.  The initial plan was to improve her blood pressures and see if her symptoms improve otherwise we will recommend an ischemic evaluation.  Unfortunately, patient continues to smoke cigarettes on a regular basis but is in the efforts of quitting.  She was smoking 1 pack/day at the last visit and now is down to 0.75 packs/day.   FUNCTIONAL STATUS: No structured exercise program or  daily routine. d  ALLERGIES: No Known Allergies  MEDICATION LIST PRIOR TO VISIT: Current Meds  Medication Sig   aspirin 81 MG chewable tablet Chew 1 tablet (81 mg total) by mouth daily.   cetirizine-pseudoephedrine (ZYRTEC-D) 5-120 MG tablet Take 1 tablet by mouth as needed.   chlorthalidone (HYGROTON) 25 MG tablet TAKE 1 TABLET(25 MG) BY MOUTH EVERY MORNING   nebivolol (BYSTOLIC) 10 MG tablet Take 10 mg by mouth daily.   olmesartan (BENICAR) 40 MG tablet Take 40 mg by mouth daily.   spironolactone (ALDACTONE) 25 MG tablet Take 25 mg by mouth daily.     PAST MEDICAL HISTORY: Past Medical History:  Diagnosis Date   Anxiety and depression    Asthma    childhood   Bronchitis    DVT (deep venous thrombosis) (HCC)    Left Leg    Hypertension    Numbness    Sleep apnea    does not use CPAP   TIA (transient ischemic attack) 2020    PAST SURGICAL HISTORY: Past Surgical History:  Procedure Laterality Date   TUBAL LIGATION     WRIST SURGERY      FAMILY HISTORY: The patient family history includes Dementia in her maternal grandfather; Diabetes in her maternal grandfather, maternal grandmother, and mother; Hypertension in her maternal grandfather and maternal grandmother; Other in her father and mother; Pulmonary embolism in her son.  SOCIAL HISTORY:  The patient  reports that she has been smoking cigarettes. She has been smoking an average of 1 pack per day. She has never used smokeless tobacco. She reports current alcohol  use of about 14.0 standard drinks per week. She reports that she does not use drugs.  REVIEW OF SYSTEMS: Review of Systems  Constitutional: Negative for chills and fever.  HENT:  Negative for hoarse voice and nosebleeds.   Eyes:  Negative for discharge, double vision and pain.  Cardiovascular:  Positive for chest pain. Negative for claudication, dyspnea on exertion, leg swelling, near-syncope, orthopnea, palpitations, paroxysmal nocturnal dyspnea and syncope.   Respiratory:  Negative for hemoptysis and shortness of breath.   Musculoskeletal:  Negative for muscle cramps and myalgias.  Gastrointestinal:  Negative for abdominal pain, constipation, diarrhea, hematemesis, hematochezia, melena, nausea and vomiting.  Neurological:  Negative for dizziness and light-headedness.   PHYSICAL EXAM: Vitals with BMI 08/01/2021 07/21/2021 06/20/2021  Height 5\' 4"  5\' 4"  -  Weight 178 lbs 6 oz 170 lbs -  BMI 46.27 03.50 -  Systolic 093 - 818  Diastolic 79 - 82  Pulse 84 - 73  Some encounter information is confidential and restricted. Go to Review Flowsheets activity to see all data.    CONSTITUTIONAL: Well-developed and well-nourished. No acute distress.  SKIN: Skin is warm and dry. No rash noted. No cyanosis. No pallor. No jaundice HEAD: Normocephalic and atraumatic.  EYES: No scleral icterus MOUTH/THROAT: Moist oral membranes.  NECK: No JVD present. No thyromegaly noted. No carotid bruits  LYMPHATIC: No visible cervical adenopathy.  CHEST Normal respiratory effort. No intercostal retractions  LUNGS: Clear to auscultation bilaterally.  No stridor. No wheezes. No rales.  CARDIOVASCULAR: Regular rate and rhythm, positive S1-S2, no murmurs rubs or gallops appreciated ABDOMINAL: soft, nontender, nondistended, positive bowel sounds in all 4 quadrants, no apparent ascites.  EXTREMITIES: No peripheral edema  HEMATOLOGIC: No significant bruising NEUROLOGIC: Oriented to person, place, and time. Nonfocal. Normal muscle tone.  PSYCHIATRIC: Normal mood and affect. Normal behavior. Cooperative  RADIOLOGY: MRA of neck 11/02/2018: This is a normal MR angiogram of the intracranial arteries.   MRA of head 11/02/2018: This appears to be a normal MR angiogram of the intracranial arteries.     There is some movement artifact limiting interpretation.  However, there did not appear to be any hemodynamically significant regions of stenosis intracranial aneurysms.   US carotid  artery showed no large vessel disease. (Per Dr. Krista Blue note)  CARDIAC DATABASE: EKG: 05/07/2021: Normal sinus rhythm, 73 bpm, normal axis, left atrial enlargement, LVH per voltage criteria, nonspecific T wave abnormality.  Echocardiogram: 05/14/2021: Normal LV systolic function with visual EF 55-60%. Left ventricle cavity is normal in size. Presence of a septal bulge. Moderate to severe concentric hypertrophy of the left ventricle. Normal global wall motion. Doppler evidence of grade II (pseudonormal) diastolic dysfunction, elevated LAP. Left atrial cavity is mildly dilated. Mild tricuspid regurgitation. No evidence of pulmonary hypertension. Compared to study 03/07/2019: Grade I DD is now Grade 2 and LAP is now elevated otherwise no significant change.  Stress Testing: No results found for this or any previous visit from the past 1095 days.  Heart Catheterization: None  Renal artery duplex 07/25/2021:  Hemodynamically significant >50% stenosis bilaterally. The R/A ratio is >2.0 bilaterally.  Normal intrarenal vascular perfusion is noted in both kidneys. Renal length is within normal limits for both kidneys. Normal abdominal aorta flow velocities with no significant plaque noted.  14 day extended Holter monitor: Patch Wear Time: 12 days and 9 hours  Dominant rhythm normal sinus, followed by sinus tachycardia (burden 5%). Heart rate 64-117 bpm. Avg HR 88 bpm. No atrial fibrillation, supraventricular or ventricular  tachycardia, high grade AV block, pauses (3 seconds or longer). Total ventricular ectopic burden <1%. Total supraventricular ectopic burden <1%. Patient triggered events: 4. These did not correlate with any significant dysrhythmias.  LABORATORY DATA: CBC Latest Ref Rng & Units 10/25/2018 04/06/2015 07/16/2010  WBC 4.0 - 10.5 K/uL 4.5 4.5 7.1  Hemoglobin 12.0 - 15.0 g/dL 12.8 11.2(L) 12.0  Hematocrit 36.0 - 46.0 % 41.3 35.5(L) 36.6  Platelets 150 - 400 K/uL 234 274 253     CMP Latest Ref Rng & Units 07/01/2021 06/06/2021 02/23/2019  Glucose 65 - 99 mg/dL 86 88 87  BUN 6 - 24 mg/dL 31(H) 16 20  Creatinine 0.57 - 1.00 mg/dL 1.01(H) 0.88 0.90  Sodium 134 - 144 mmol/L 134 139 138  Potassium 3.5 - 5.2 mmol/L 4.8 4.6 4.7  Chloride 96 - 106 mmol/L 98 103 99  CO2 20 - 29 mmol/L 18(L) 20 21  Calcium 8.7 - 10.2 mg/dL 9.4 9.0 9.6    Lipid Panel  No results found for: CHOL, TRIG, HDL, CHOLHDL, VLDL, LDLCALC, LDLDIRECT, LABVLDL  No components found for: NTPROBNP No results for input(s): PROBNP in the last 8760 hours. No results for input(s): TSH in the last 8760 hours.  BMP Recent Labs    06/06/21 1253 07/01/21 1130  NA 139 134  K 4.6 4.8  CL 103 98  CO2 20 18*  GLUCOSE 88 86  BUN 16 31*  CREATININE 0.88 1.01*  CALCIUM 9.0 9.4    HEMOGLOBIN A1C Lab Results  Component Value Date   HGBA1C 5.0 04/05/2015   MPG 97 04/05/2015    Labs provided by primary care 04/11/2021: Hemoglobin 14.7 g/dL, hematocrit 44.4% BUN 13 Creatinine 0.62 mg/dL. Sodium 139, potassium 5.1, chloride 104, bicarb 22 AST 17, ALT 10, alkaline phosphatase 82 Total cholesterol 162, triglycerides 68, HDL 81, LDL 68. Hemoglobin A1c 5.2. TSH 1.79.  IMPRESSION:    ICD-10-CM   1. Precordial pain  R07.2 MR CARDIAC MORPHOLOGY W WO CONTRAST    PCV MYOCARDIAL PERFUSION WO LEXISCAN    2. Benign hypertension  I10     3. Palpitations  R00.2     4. Smoking  F17.200         RECOMMENDATIONS: Rachael Paul is a 45 y.o. female whose past medical history and cardiac risk factors include: Tobacco use, hypertension, DVT associated with pregnancy, history of TIA January 2020.  Precordial chest pain: Suggestive of noncardiac etiology. Given the LVH on EKG recommend nuclear stress test to evaluate for reversible ischemia.. Patient also has moderate to severe LVH on echocardiogram most likely secondary to uncontrolled hypertension in the past.  However we will proceed with cardiac MRI  to rule out HCM versus infiltrative cardiomyopathy. Medications reconciled. Educated on seeking medical attention sooner by going to the closest ER via EMS if the symptoms increase in intensity, frequency, duration, or has typical chest pain as discussed in the office.  Patient verbalized understanding.  Benign essential hypertension: Stopped taking her antihypertensive medications during the COVID-19 pandemic for at least 1 year.   Prior to reinitiation of therapy her SBP would be greater than 200 mmHg. Since up titration of antihypertensive medications Home blood pressures have improved and now SBP ranges between 120-130 mmHg. Renal artery duplex suggestive of possible stenosis.  Shared decision was to hold off on renal angiography for now until the work-up of chest pain is complete.   She is highly encouraged to start using her CPAP machine given her underlying sleep apnea.  Palpitations:  Well-controlled. 14-day extended Holter monitor results reviewed, no significant dysrhythmias. Ventricular rate is well controlled. TSH within normal limits. No identifiable reversible cause Monitor for now  Smoking: Tobacco cessation counseling: Currently smoking <1 packs/day   Patient was informed of the dangers of tobacco abuse including stroke, cancer, and MI, as well as benefits of tobacco cessation. Patient is willing to quit at this time. 5 mins were spent counseling patient cessation techniques. We discussed various methods to help quit smoking, including deciding on a date to quit, joining a support group, pharmacological agents- nicotine gum/patch/lozenges.  I will reassess her progress at the next follow-up visit   FINAL MEDICATION LIST END OF ENCOUNTER: No orders of the defined types were placed in this encounter.   Current Outpatient Medications:    aspirin 81 MG chewable tablet, Chew 1 tablet (81 mg total) by mouth daily., Disp: 30 tablet, Rfl: 0   cetirizine-pseudoephedrine  (ZYRTEC-D) 5-120 MG tablet, Take 1 tablet by mouth as needed., Disp: , Rfl:    chlorthalidone (HYGROTON) 25 MG tablet, TAKE 1 TABLET(25 MG) BY MOUTH EVERY MORNING, Disp: 90 tablet, Rfl: 2   nebivolol (BYSTOLIC) 10 MG tablet, Take 10 mg by mouth daily., Disp: , Rfl:    olmesartan (BENICAR) 40 MG tablet, Take 40 mg by mouth daily., Disp: , Rfl:    spironolactone (ALDACTONE) 25 MG tablet, Take 25 mg by mouth daily., Disp: , Rfl:   Orders Placed This Encounter  Procedures   MR CARDIAC MORPHOLOGY W WO CONTRAST   PCV MYOCARDIAL PERFUSION WO LEXISCAN     There are no Patient Instructions on file for this visit.   --Continue cardiac medications as reconciled in final medication list. --Return in about 4 weeks (around 08/29/2021) for Reevaluation of, Chest pain, Review test results. Or sooner if needed. --Continue follow-up with your primary care physician regarding the management of your other chronic comorbid conditions.  Patient's questions and concerns were addressed to her satisfaction. She voices understanding of the instructions provided during this encounter.   This note was created using a voice recognition software as a result there may be grammatical errors inadvertently enclosed that do not reflect the nature of this encounter. Every attempt is made to correct such errors.  Rex Kras, [place], [facility]  Pager: [phone] Office: [phone]

## 2021-08-04 ENCOUNTER — Encounter: Payer: Managed Care, Other (non HMO) | Admitting: Internal Medicine

## 2021-08-13 ENCOUNTER — Other Ambulatory Visit: Payer: Self-pay

## 2021-08-13 ENCOUNTER — Ambulatory Visit: Payer: Managed Care, Other (non HMO)

## 2021-08-13 DIAGNOSIS — R072 Precordial pain: Secondary | ICD-10-CM

## 2021-08-14 LAB — PCV MYOCARDIAL PERFUSION WO LEXISCAN
Angina Index: 1
ST Depression (mm): 0 mm

## 2021-08-22 ENCOUNTER — Encounter: Payer: Self-pay | Admitting: Internal Medicine

## 2021-08-22 ENCOUNTER — Other Ambulatory Visit: Payer: Self-pay

## 2021-08-22 ENCOUNTER — Ambulatory Visit (AMBULATORY_SURGERY_CENTER): Payer: Managed Care, Other (non HMO) | Admitting: Internal Medicine

## 2021-08-22 VITALS — BP 118/66 | HR 76 | Temp 98.0°F | Resp 13 | Ht 64.0 in | Wt 170.0 lb

## 2021-08-22 DIAGNOSIS — D124 Benign neoplasm of descending colon: Secondary | ICD-10-CM | POA: Diagnosis not present

## 2021-08-22 DIAGNOSIS — Z1211 Encounter for screening for malignant neoplasm of colon: Secondary | ICD-10-CM

## 2021-08-22 DIAGNOSIS — D120 Benign neoplasm of cecum: Secondary | ICD-10-CM | POA: Diagnosis not present

## 2021-08-22 MED ORDER — SODIUM CHLORIDE 0.9 % IV SOLN: 500.0000 mL | Freq: Once | INTRAVENOUS | Status: DC

## 2021-08-22 NOTE — Progress Notes (Signed)
GASTROENTEROLOGY PROCEDURE H&P NOTE   Primary Care Physician: Shanon Rosser, PA-C    Reason for Procedure:  Colorectal cancer screening  Plan:    Colonoscopy  Patient is appropriate for endoscopic procedure(s) in the ambulatory ([facility]) setting.  The nature of the procedure, as well as the risks, benefits, and alternatives were carefully and thoroughly reviewed with the patient. Ample time for discussion and questions allowed. The patient understood, was satisfied, and agreed to proceed.     HPI: Rachael Paul is a 45 y.o. female who presents for colonoscopy.  Medical history as below.  Tolerated the prep.  No recent chest pain or shortness of breath.  No abdominal pain.  Past Medical History:  Diagnosis Date   Anxiety and depression    Asthma    childhood   Bronchitis    DVT (deep venous thrombosis) (HCC)    Left Leg    Hypertension    Numbness    Sleep apnea    does not use CPAP   TIA (transient ischemic attack) 2020    Past Surgical History:  Procedure Laterality Date   TUBAL LIGATION     WRIST SURGERY      Prior to Admission medications   Medication Sig Start Date End Date Taking? Authorizing Provider  chlorthalidone (HYGROTON) 25 MG tablet TAKE 1 TABLET(25 MG) BY MOUTH EVERY MORNING 06/23/21  Yes Tolia, Sunit, DO  nebivolol (BYSTOLIC) 10 MG tablet Take 10 mg by mouth daily.   Yes [provider]  olmesartan (BENICAR) 40 MG tablet Take 40 mg by mouth daily. 07/15/21  Yes [provider]  aspirin 81 MG chewable tablet Chew 1 tablet (81 mg total) by mouth daily. 10/26/18   Langston Masker B, PA-C  cetirizine-pseudoephedrine (ZYRTEC-D) 5-120 MG tablet Take 1 tablet by mouth as needed.    [provider]  spironolactone (ALDACTONE) 25 MG tablet Take 25 mg by mouth daily.    [provider]    Current Outpatient Medications  Medication Sig Dispense Refill   chlorthalidone (HYGROTON) 25 MG tablet TAKE 1 TABLET(25 MG) BY MOUTH EVERY  MORNING 90 tablet 2   nebivolol (BYSTOLIC) 10 MG tablet Take 10 mg by mouth daily.     olmesartan (BENICAR) 40 MG tablet Take 40 mg by mouth daily.     aspirin 81 MG chewable tablet Chew 1 tablet (81 mg total) by mouth daily. 30 tablet 0   cetirizine-pseudoephedrine (ZYRTEC-D) 5-120 MG tablet Take 1 tablet by mouth as needed.     spironolactone (ALDACTONE) 25 MG tablet Take 25 mg by mouth daily.     Current Facility-Administered Medications  Medication Dose Route Frequency Provider Last Rate Last Admin   0.9 %  sodium chloride infusion  500 mL Intravenous Once Benedicto Capozzi, Lajuan Lines, MD        Allergies as of 08/22/2021   (No Known Allergies)    Family History  Problem Relation Age of Onset   Other Mother        drug related death   Diabetes Mother    Other Father        unsure of history   Hypertension Maternal Grandmother    Diabetes Maternal Grandmother    Diabetes Maternal Grandfather    Dementia Maternal Grandfather    Hypertension Maternal Grandfather    Pulmonary embolism Son    Colon cancer Neg Hx    Colon polyps Neg Hx    Esophageal cancer Neg Hx    Stomach cancer Neg Hx  Rectal cancer Neg Hx     Social History   Socioeconomic History   Marital status: Single    Spouse name: Not on file   Number of children: 4   Years of education: 12   Highest education level: High school graduate  Occupational History   Occupation: customer service  Tobacco Use   Smoking status: Every Day    Packs/day: 1.00    Types: Cigarettes   Smokeless tobacco: Never  Vaping Use   Vaping Use: Never used  Substance and Sexual Activity   Alcohol use: Yes    Alcohol/week: 14.0 standard drinks    Types: 14 Glasses of wine per week    Comment: 2 glasses of wine daily   Drug use: No   Sexual activity: Not Currently  Other Topics Concern   Not on file  Social History Narrative   Lives at home with son.   Right-handed.   Occasional caffeine use.   Social Determinants of Health    Financial Resource Strain: Not on file  Food Insecurity: Not on file  Transportation Needs: Not on file  Physical Activity: Not on file  Stress: Not on file  Social Connections: Not on file  Intimate Partner Violence: Not on file    Physical Exam: Vital signs in last 24 hours: @BP  118/67   Pulse 73   Temp 98 F (36.7 C)   Ht 5\' 4"  (1.626 m)   Wt 170 lb (77.1 kg)   SpO2 100%   BMI 29.18 kg/m  GEN: NAD EYE: Sclerae anicteric ENT: MMM CV: Non-tachycardic Pulm: CTA b/l GI: Soft, NT/ND NEURO:  Alert & Oriented x 3   Rachael Jarred, MD [facility]  08/22/2021 2:22 PM

## 2021-08-22 NOTE — Op Note (Signed)
Rachael Paul Procedure Date: 08/22/2021 2:25 PM MRN: [id number] Endoscopist: Jerene Bears , MD Age: 45 Referring MD:  Date of Birth: 1975-11-19 Gender: Female Account #: [id number] Procedure:                Colonoscopy Indications:              Screening for colorectal malignant neoplasm, This                            is the patient's first colonoscopy Medicines:                Monitored Anesthesia Care Procedure:                Pre-Anesthesia Assessment:                           - Prior to the procedure, a History and Physical                            was performed, and patient medications and                            allergies were reviewed. The patient's tolerance of                            previous anesthesia was also reviewed. The risks                            and benefits of the procedure and the sedation                            options and risks were discussed with the patient.                            All questions were answered, and informed consent                            was obtained. Prior Anticoagulants: The patient has                            taken no previous anticoagulant or antiplatelet                            agents. ASA Grade Assessment: II - A patient with                            mild systemic disease. After reviewing the risks                            and benefits, the patient was deemed in                            satisfactory condition to undergo the procedure.  After obtaining informed consent, the colonoscope                            was passed under direct vision. Throughout the                            procedure, the patient's blood pressure, pulse, and                            oxygen saturations were monitored continuously. The                            [id number]-HQ190L Colonoscope was introduced through the                            anus and advanced to  the cecum, identified by                            appendiceal orifice and ileocecal valve. The                            colonoscopy was performed without difficulty. The                            patient tolerated the procedure well. The quality                            of the bowel preparation was good. The ileocecal                            valve, appendiceal orifice, and rectum were                            photographed. Scope In: 2:32:28 PM Scope Out: 2:43:20 PM Scope Withdrawal Time: 0 hours 7 minutes 25 seconds  Total Procedure Duration: 0 hours 10 minutes 52 seconds  Findings:                 The digital rectal exam was normal.                           A 3 mm polyp was found in the cecum. The polyp was                            sessile. The polyp was removed with a cold snare.                            Resection and retrieval were complete.                           A 4 mm polyp was found in the descending colon. The                            polyp was sessile. The polyp was removed  with a                            cold snare. Resection and retrieval were complete.                           Internal hemorrhoids were found during                            retroflexion. The hemorrhoids were small.                           The exam was otherwise without abnormality. Complications:            No immediate complications. Estimated Blood Loss:     Estimated blood loss was minimal. Impression:               - One 3 mm polyp in the cecum, removed with a cold                            snare. Resected and retrieved.                           - One 4 mm polyp in the descending colon, removed                            with a cold snare. Resected and retrieved.                           - Small internal hemorrhoids.                           - The examination was otherwise normal. Recommendation:           - Patient has a contact number available for                             emergencies. The signs and symptoms of potential                            delayed complications were discussed with the                            patient. Return to normal activities tomorrow.                            Written discharge instructions were provided to the                            patient.                           - Resume previous diet.                           - Continue present medications.                           -  Await pathology results.                           - Repeat colonoscopy is recommended. The                            colonoscopy date will be determined after pathology                            results from today's exam become available for                            review. Jerene Bears, MD 08/22/2021 2:45:45 PM This report has been signed electronically.

## 2021-08-22 NOTE — Patient Instructions (Signed)
Read all of the handouts given to you by your recovery room nurse. ? ?YOU HAD AN ENDOSCOPIC PROCEDURE TODAY AT THE [facility]:   Refer to the procedure report that was given to you for any specific questions about what was found during the examination.  If the procedure report does not answer your questions, please call your gastroenterologist to clarify.  If you requested that your care partner not be given the details of your procedure findings, then the procedure report has been included in a sealed envelope for you to review at your convenience later. ? ?YOU SHOULD EXPECT: Some feelings of bloating in the abdomen. Passage of more gas than usual.  Walking can help get rid of the air that was put into your GI tract during the procedure and reduce the bloating. If you had a lower endoscopy (such as a colonoscopy or flexible sigmoidoscopy) you may notice spotting of blood in your stool or on the toilet paper. If you underwent a bowel prep for your procedure, you may not have a normal bowel movement for a few days. ? ?Please Note:  You might notice some irritation and congestion in your nose or some drainage.  This is from the oxygen used during your procedure.  There is no need for concern and it should clear up in a day or so. ? ?SYMPTOMS TO REPORT IMMEDIATELY: ? ?Following lower endoscopy (colonoscopy or flexible sigmoidoscopy): ? Excessive amounts of blood in the stool ? Significant tenderness or worsening of abdominal pains ? Swelling of the abdomen that is new, acute ? Fever of 100?F or higher ? ?  ?For urgent or emergent issues, a gastroenterologist can be reached at any hour by calling [phone]. ?Do not use MyChart messaging for urgent concerns.  ? ? ?DIET:  We do recommend a small meal at first, but then you may proceed to your regular diet.  Drink plenty of fluids but you should avoid alcoholic beverages for 24 hours. ? ?ACTIVITY:  You should plan to take it easy for the rest of today  and you should NOT DRIVE or use heavy machinery until tomorrow (because of the sedation medicines used during the test).   ? ?FOLLOW UP: ?Our staff will call the number listed on your records 48-72 hours following your procedure to check on you and address any questions or concerns that you may have regarding the information given to you following your procedure. If we do not reach you, we will leave a message.  We will attempt to reach you two times.  During this call, we will ask if you have developed any symptoms of COVID 19. If you develop any symptoms (ie: fever, flu-like symptoms, shortness of breath, cough etc.) before then, please call [phone].  If you test positive for Covid 19 in the 2 weeks post procedure, please call and report this information to us.   ? ?If any biopsies were taken you will be contacted by phone or by letter within the next 1-3 weeks.  Please call us at [phone] if you have not heard about the biopsies in 3 weeks.  ? ? ?SIGNATURES/CONFIDENTIALITY: ?You and/or your care partner have signed paperwork which will be entered into your electronic medical record.  These signatures attest to the fact that that the information above on your After Visit Summary has been reviewed and is understood.  Full responsibility of the confidentiality of this discharge information lies with you and/or your care-partner.  ?

## 2021-08-22 NOTE — Progress Notes (Signed)
Report to PACU, RN, vss, BBS= Clear.  

## 2021-08-22 NOTE — Progress Notes (Signed)
Called to room to assist during endoscopic procedure.  Patient ID and intended procedure confirmed with present staff. Received instructions for my participation in the procedure from the performing physician.  

## 2021-08-22 NOTE — Progress Notes (Signed)
Pt's states no medical or surgical changes since previsit or office visit. VS by DT. °

## 2021-08-26 ENCOUNTER — Telehealth: Payer: Self-pay

## 2021-08-26 NOTE — Telephone Encounter (Signed)
First attempt follow up call to pt, no answer. 

## 2021-08-26 NOTE — Telephone Encounter (Signed)
Attempted to reach pt. With follow-up call following endoscopic procedure 08/22/2021.  LM on pt. Voice mail to call if she has any questions or concerns.

## 2021-08-27 ENCOUNTER — Encounter: Payer: Self-pay | Admitting: Internal Medicine

## 2021-08-29 ENCOUNTER — Ambulatory Visit: Payer: Managed Care, Other (non HMO) | Admitting: Cardiology

## 2021-10-17 ENCOUNTER — Ambulatory Visit: Payer: Managed Care, Other (non HMO) | Admitting: Cardiology

## 2021-12-02 IMAGING — MG MM BREAST LOCALIZATION CLIP
4 series · 4 of 12 positions shown · non-contrast
Comparison: Previous exam(s).

CLINICAL DATA: Post procedure mammogram for clip placement.

EXAM:
DIAGNOSTIC RIGHT MAMMOGRAM POST ULTRASOUND BIOPSY

[R ML synth-2D]
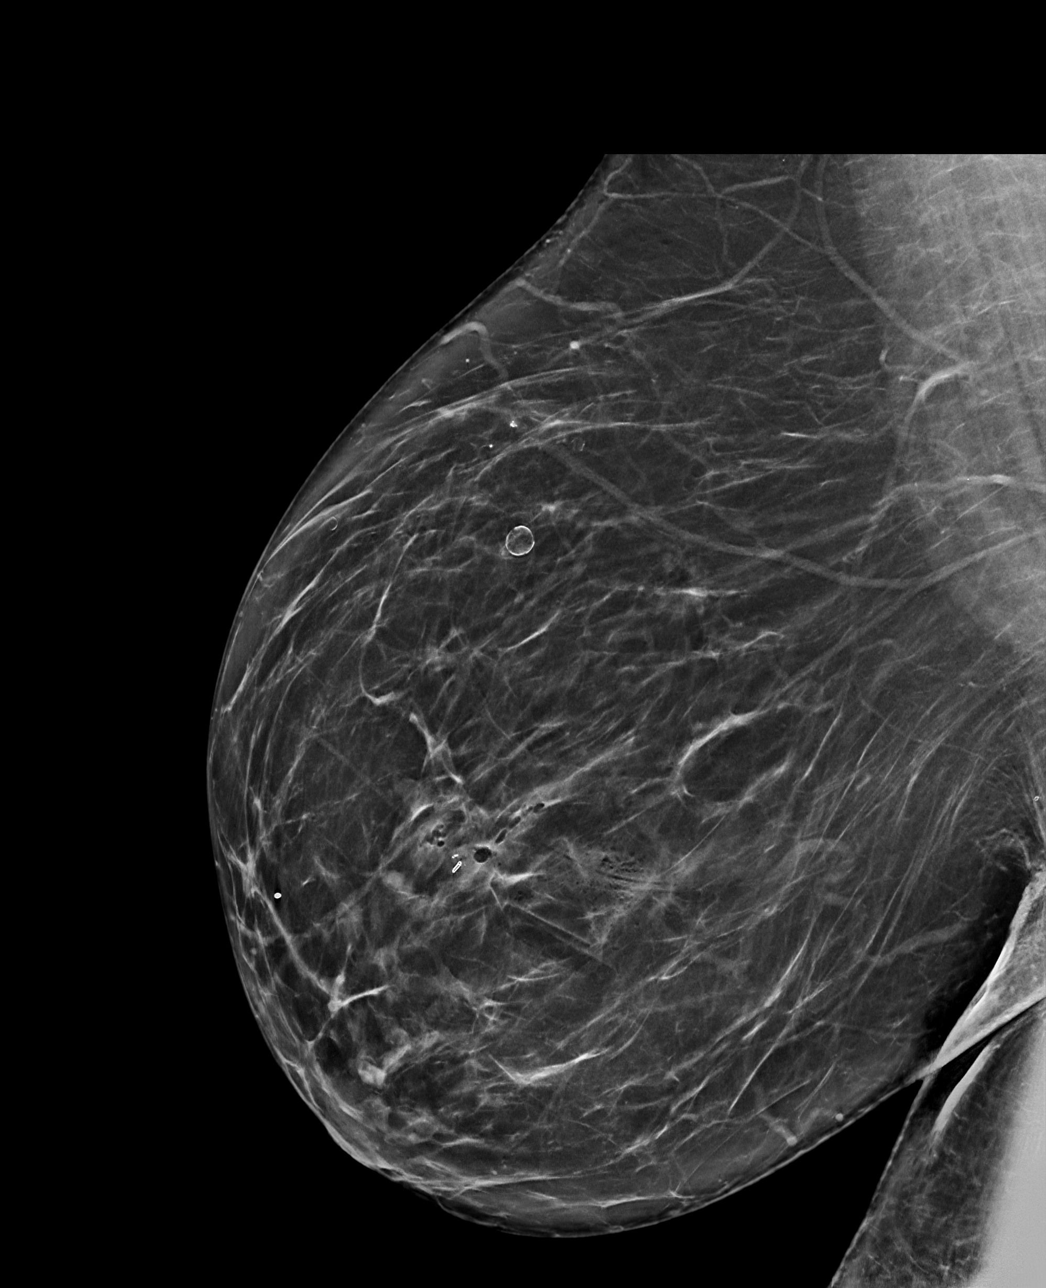

[R CC synth-2D]
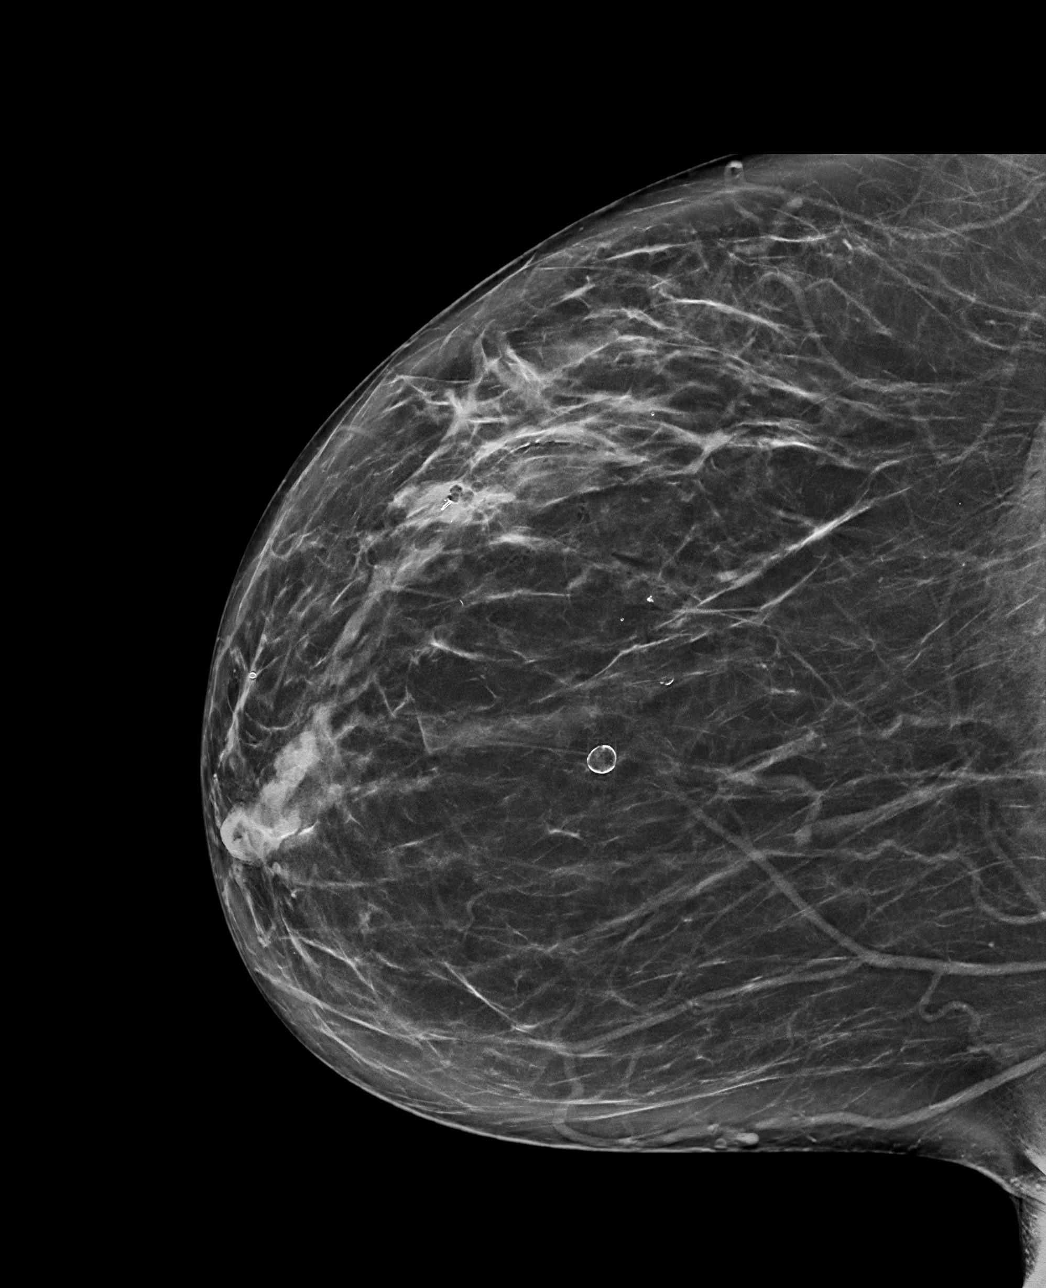

[R ML tomo · tomo slice 45/88.0]
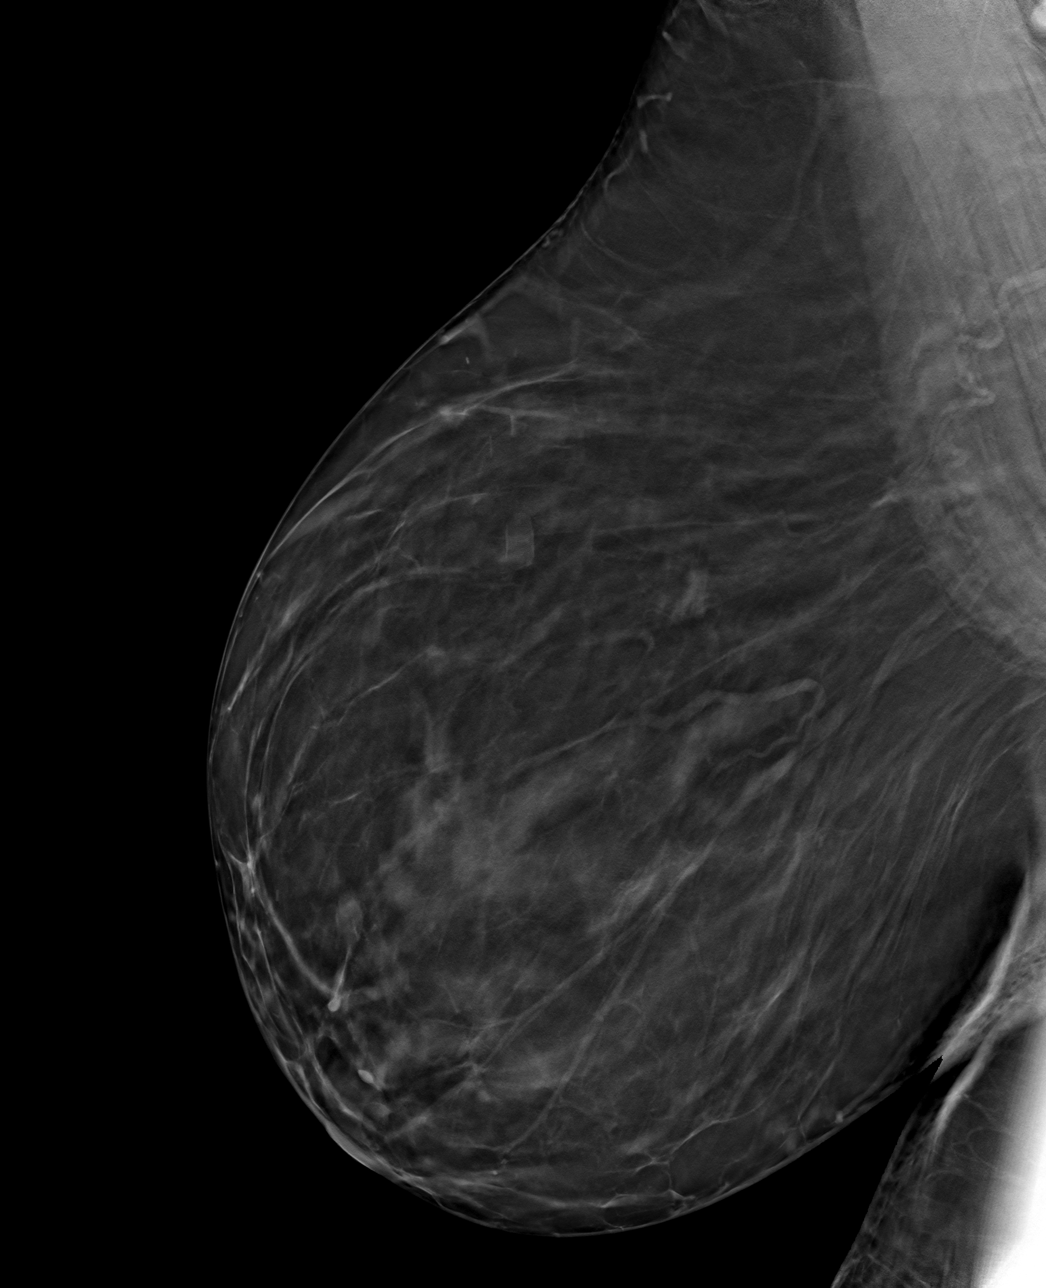

[R CC tomo · tomo slice 43/85.0]
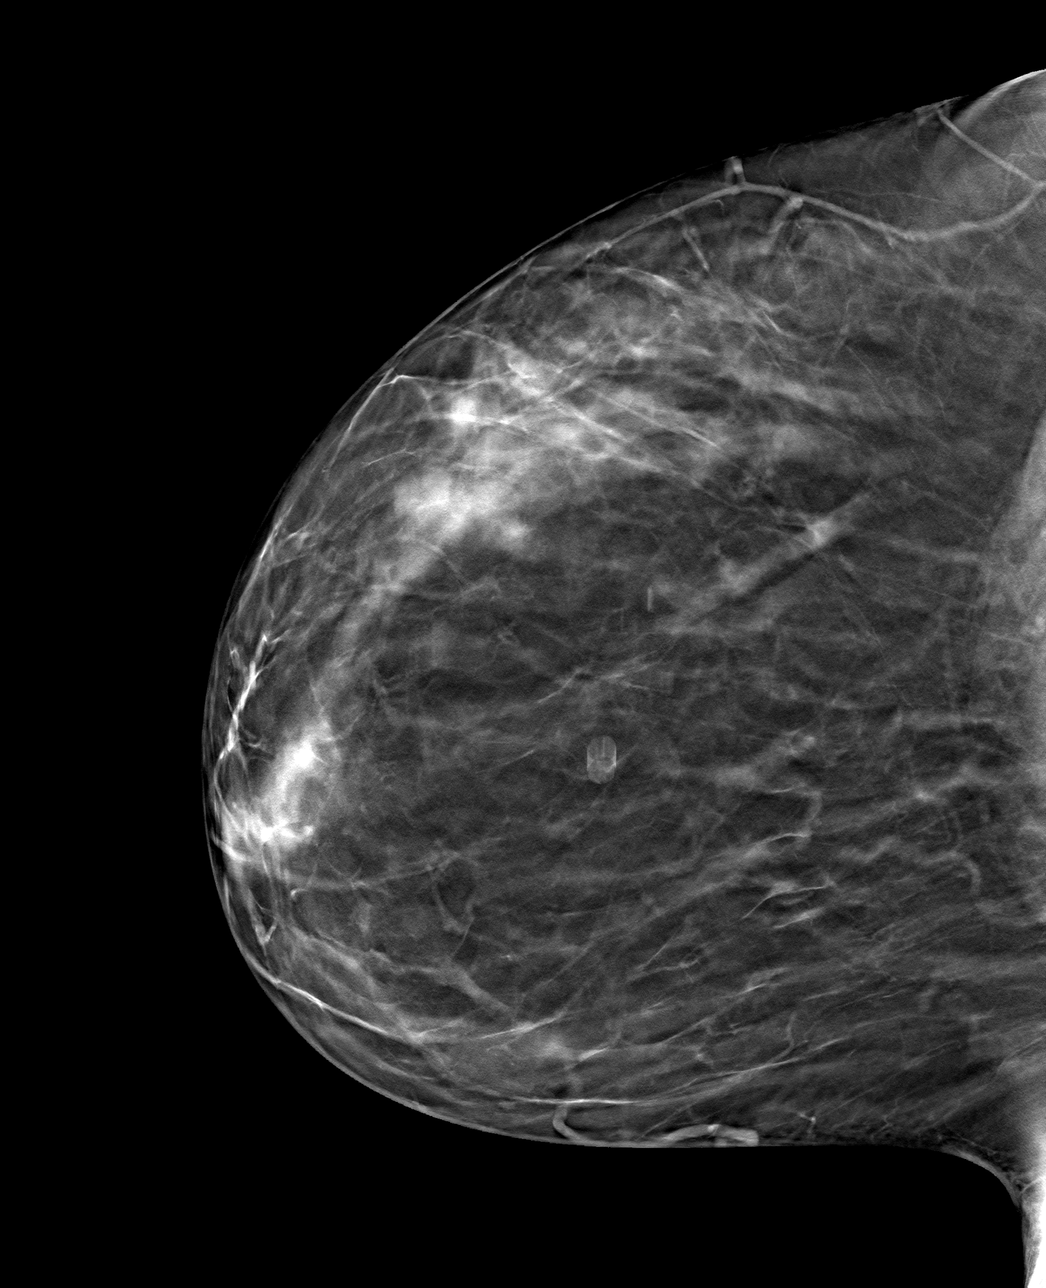

[4 of 12 positions shown; findings below may reference images not displayed]

FINDINGS: Mammographic images were obtained following ultrasound guided biopsy
of a right breast mass at 9 o'clock. The heart biopsy marking clip
is in expected position at the site of biopsy.
IMPRESSION: Appropriate positioning of the heart shaped biopsy marking clip at
the site of biopsy in the right breast at 9 o'clock.

Final Assessment: Post Procedure Mammograms for Marker Placement

## 2021-12-02 IMAGING — US US  BREAST BX W/ LOC DEV 1ST LESION IMG BX SPEC US GUIDE*R*
1 series · 10 of 10 positions shown · non-contrast
Comparison: Previous exam(s).
COMPARISON: Previous exam(s).

Addendum:
CLINICAL DATA: 44-year-old female presenting for biopsy of a right
breast mass.

EXAM:
ULTRASOUND GUIDED RIGHT BREAST CORE NEEDLE BIOPSY

[Series 1: us breast bx w/ loc dev 1st lesion img bx spec us  · 0.07mm/px · 10 of 10 slices shown]
[im 1/10]
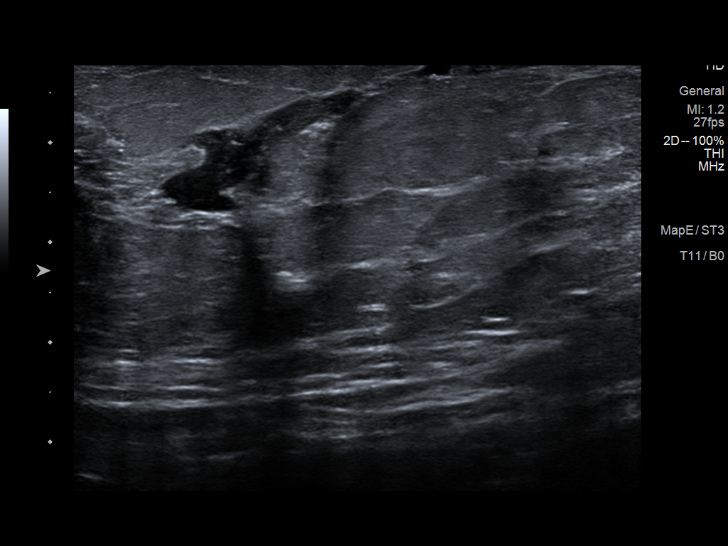
[im 2/10]
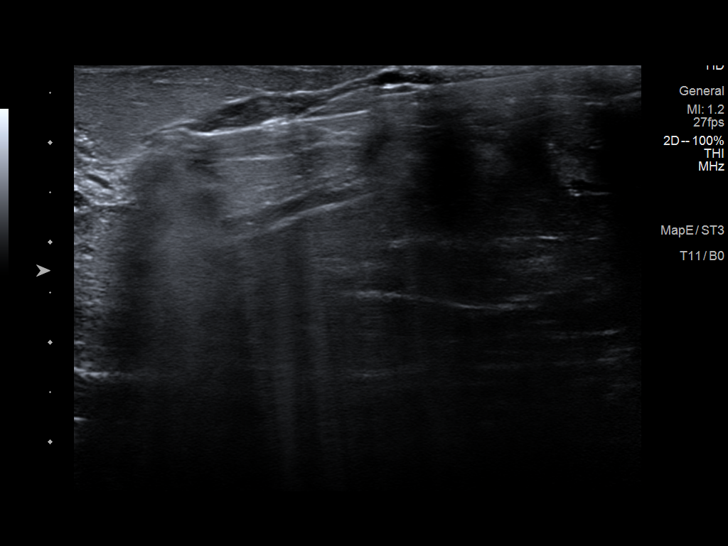
[im 3/10]
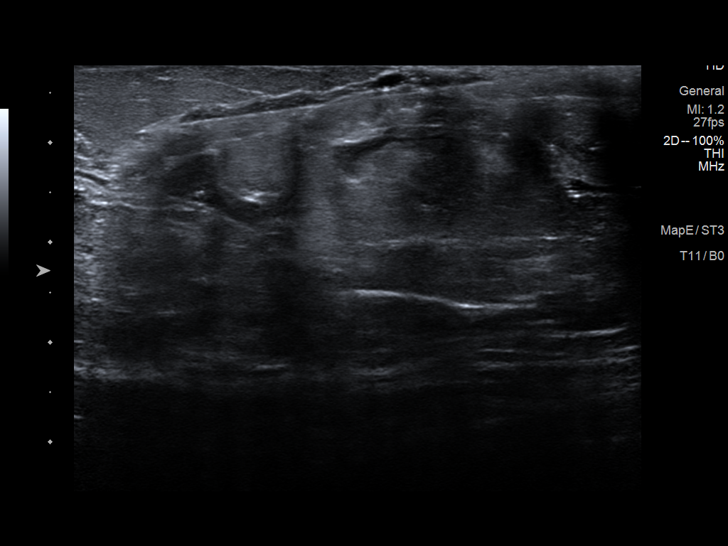
[im 4/10]
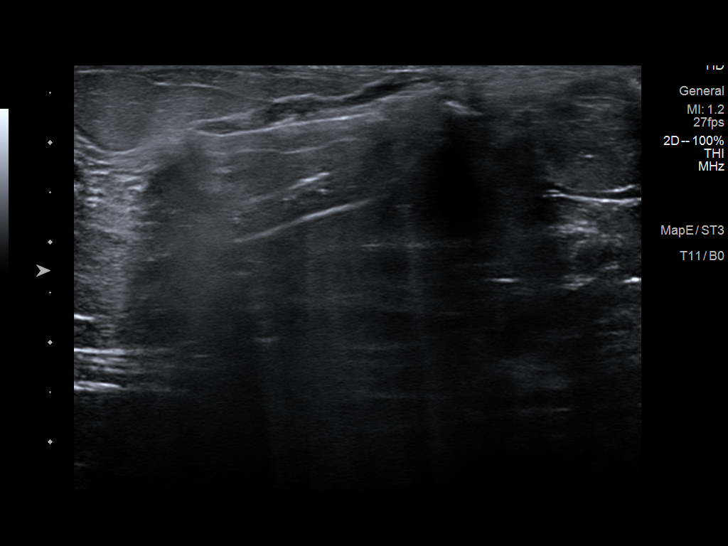
[im 5/10]
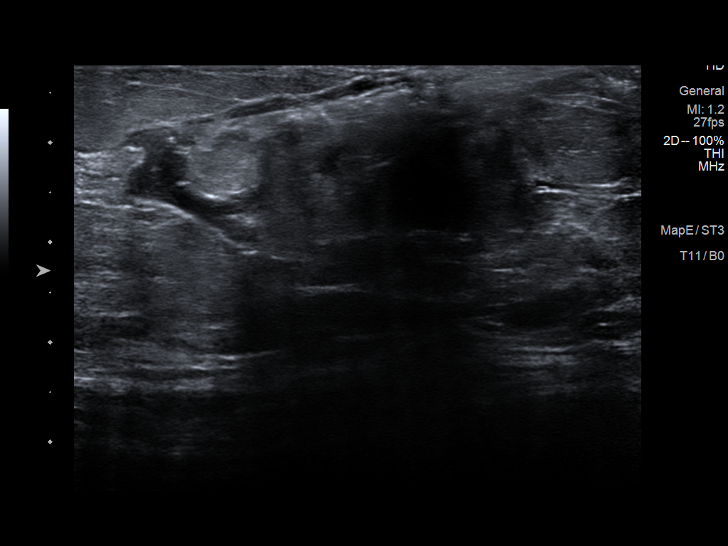
[im 6/10]
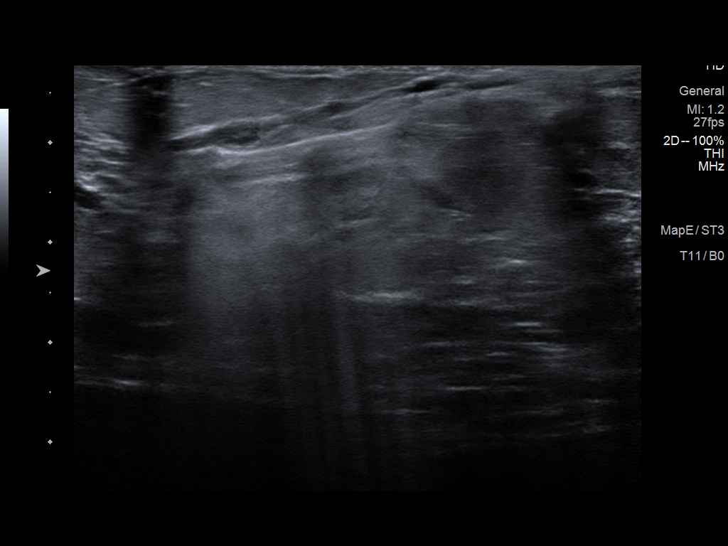
[im 7/10]
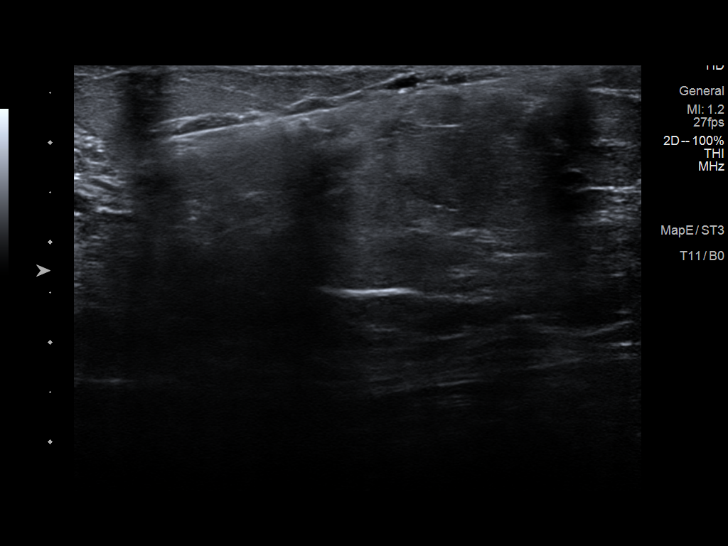
[im 8/10]
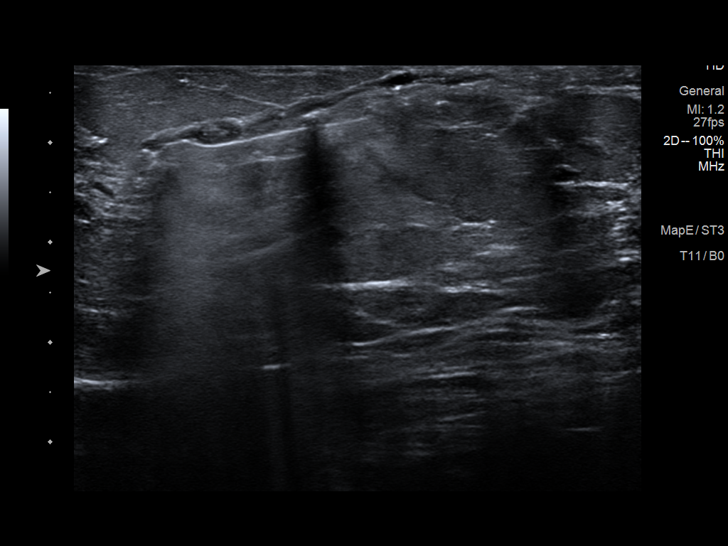
[im 9/10]
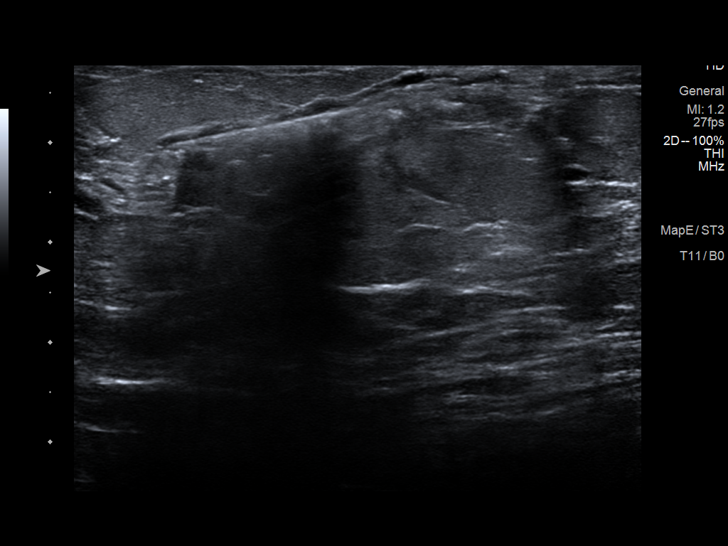
[im 10/10]
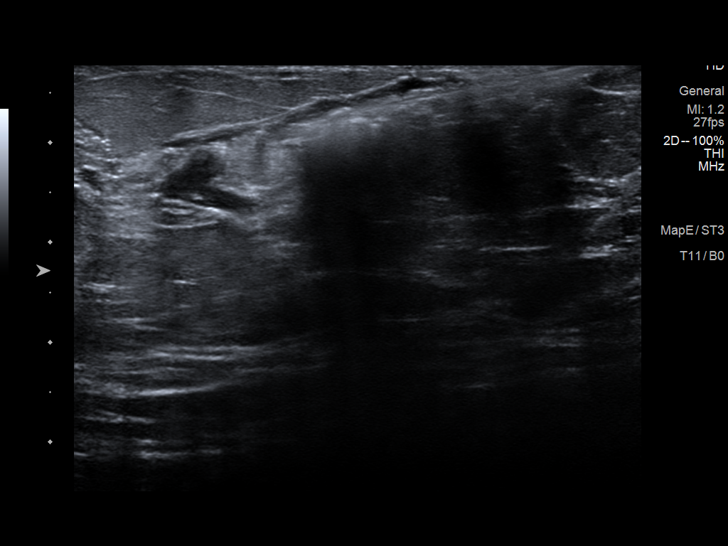

[10 of 10 positions shown; findings below may reference images not displayed]



Lesion quadrant: Upper outer quadrant

Using sterile technique and 1% Lidocaine as local anesthetic, under
direct ultrasound visualization, a 14 gauge Hossan device was
used to perform biopsy of a right breast mass at 9 o'clock using a
lateral approach. At the conclusion of the procedure a heart tissue
marker clip was deployed into the biopsy cavity. Follow up 2 view
mammogram was performed and dictated separately.
IMPRESSION: Ultrasound guided biopsy of a right breast mass at 9 o'clock. No
apparent complications.

ADDENDUM:
Pathology revealed INTRADUCTAL PAPILLOMA WITH CALCIFICATIONS of the
Right breast, 9 o'clock. This was found to be concordant by Dr.
Jalon Bettencourt, with surgical consultation for consideration of
excision recommended.

Pathology results were discussed with the patient by telephone. The
patient reported doing well after the biopsy with tenderness at the
site. Post biopsy instructions and care were reviewed and questions
were answered. The patient was encouraged to call The [REDACTED] for any additional concerns. My direct phone
number was provided.

Surgical consultation has been arranged with Dr. Nebojsa Marko Ethien at
[REDACTED] on December 27, 2020.

Pathology results reported by Manisha Petit, RN on 11/28/2020.



Lesion quadrant: Upper outer quadrant

Using sterile technique and 1% Lidocaine as local anesthetic, under
direct ultrasound visualization, a 14 gauge Hossan device was
used to perform biopsy of a right breast mass at 9 o'clock using a
lateral approach. At the conclusion of the procedure a heart tissue
marker clip was deployed into the biopsy cavity. Follow up 2 view
mammogram was performed and dictated separately.
IMPRESSION: Ultrasound guided biopsy of a right breast mass at 9 o'clock. No
apparent complications.

## 2022-09-24 ENCOUNTER — Emergency Department (HOSPITAL_BASED_OUTPATIENT_CLINIC_OR_DEPARTMENT_OTHER): Payer: Managed Care, Other (non HMO)

## 2022-09-24 ENCOUNTER — Other Ambulatory Visit: Payer: Self-pay

## 2022-09-24 ENCOUNTER — Emergency Department (HOSPITAL_BASED_OUTPATIENT_CLINIC_OR_DEPARTMENT_OTHER)
Admission: EM | Admit: 2022-09-24 | Discharge: 2022-09-24 | Disposition: A | Discharge status: Home / Self Care | Payer: Managed Care, Other (non HMO) | Attending: Emergency Medicine | Admitting: Emergency Medicine

## 2022-09-24 ENCOUNTER — Encounter (HOSPITAL_BASED_OUTPATIENT_CLINIC_OR_DEPARTMENT_OTHER): Payer: Self-pay | Admitting: Emergency Medicine

## 2022-09-24 DIAGNOSIS — R509 Fever, unspecified: Secondary | ICD-10-CM | POA: Diagnosis present

## 2022-09-24 DIAGNOSIS — R059 Cough, unspecified: Secondary | ICD-10-CM | POA: Insufficient documentation

## 2022-09-24 DIAGNOSIS — Z7982 Long term (current) use of aspirin: Secondary | ICD-10-CM | POA: Insufficient documentation

## 2022-09-24 DIAGNOSIS — Z1152 Encounter for screening for COVID-19: Secondary | ICD-10-CM | POA: Diagnosis not present

## 2022-09-24 DIAGNOSIS — R0981 Nasal congestion: Secondary | ICD-10-CM | POA: Insufficient documentation

## 2022-09-24 DIAGNOSIS — M791 Myalgia, unspecified site: Secondary | ICD-10-CM | POA: Insufficient documentation

## 2022-09-24 DIAGNOSIS — J101 Influenza due to other identified influenza virus with other respiratory manifestations: Secondary | ICD-10-CM

## 2022-09-24 DIAGNOSIS — R519 Headache, unspecified: Secondary | ICD-10-CM | POA: Insufficient documentation

## 2022-09-24 DIAGNOSIS — R000 Tachycardia, unspecified: Secondary | ICD-10-CM | POA: Insufficient documentation

## 2022-09-24 LAB — RESP PANEL BY RT-PCR (RSV, FLU A&B, COVID)  RVPGX2
Influenza A by PCR: POSITIVE — AB
Influenza B by PCR: NEGATIVE
Resp Syncytial Virus by PCR: NEGATIVE
SARS Coronavirus 2 by RT PCR: NEGATIVE

## 2022-09-24 MED ORDER — ALBUTEROL SULFATE HFA 108 (90 BASE) MCG/ACT IN AERS: 2.0000 | INHALATION_SPRAY | RESPIRATORY_TRACT | Status: DC | PRN

## 2022-09-24 NOTE — ED Triage Notes (Signed)
Fever, body aches, cough, sneezing since yesterday. Reports SHOB and chest pain when coughing. Some relief with nebulizer at home.

## 2022-09-24 NOTE — Discharge Instructions (Addendum)
Check plenty of fluids.  Take Tylenol Motrin to help keep your fever down.  Return to the ED for new or concerning symptoms otherwise follow-up with your primary.

## 2022-09-24 NOTE — ED Provider Notes (Signed)
[place] EMER[facility]ENCY DEPARTMENT Provider Note   CSN: [id number] Arrival date & time: 09/24/22  1801     History  Chief Complaint  Patient presents with   Fever    Rachael Paul is a 46 y.o. female.   Fever    Multiple complaints.  She is having generalized bodyaches worse in her chest.  She has been having a nonproductive cough, fevers, headache and nasal congestion.  She has been taking cold medicine with some improvement.  Home Medications Prior to Admission medications   Medication Sig Start Date End Date Taking? Authorizing Provider  aspirin 81 MG chewable tablet Chew 1 tablet (81 mg total) by mouth daily. 10/26/18   Langston Masker B, PA-C  cetirizine-pseudoephedrine (ZYRTEC-D) 5-120 MG tablet Take 1 tablet by mouth as needed.    [provider]  chlorthalidone (HYGROTON) 25 MG tablet TAKE 1 TABLET(25 MG) BY MOUTH EVERY MORNING 06/23/21   Tolia, Sunit, DO  nebivolol (BYSTOLIC) 10 MG tablet Take 10 mg by mouth daily.    [provider]  olmesartan (BENICAR) 40 MG tablet Take 40 mg by mouth daily. 07/15/21   [provider]  spironolactone (ALDACTONE) 25 MG tablet Take 25 mg by mouth daily.    [provider]      Allergies    Patient has no known allergies.    Review of Systems   Review of Systems  Constitutional:  Positive for fever.    Physical Exam Updated Vital Signs BP (!) 159/91 (BP Location: Right Arm)   Pulse (!) 107   Temp 99.9 F (37.7 C) (Oral)   Resp 20   Ht '5\' 4"'$  (1.626 m)   Wt 81.6 kg   SpO2 98%   BMI 30.90 kg/m  Physical Exam Vitals and nursing note reviewed. Exam conducted with a chaperone present.  Constitutional:      Appearance: Normal appearance.  HENT:     Head: Normocephalic and atraumatic.  Eyes:     General: No scleral icterus.       Right eye: No discharge.        Left eye: No discharge.     Extraocular Movements: Extraocular movements intact.     Pupils: Pupils are equal,  round, and reactive to light.  Cardiovascular:     Rate and Rhythm: Regular rhythm. Tachycardia present.     Pulses: Normal pulses.     Heart sounds: Normal heart sounds. No murmur heard.    No friction rub. No gallop.     Comments: Tachycardic with regular rhythm Pulmonary:     Effort: Pulmonary effort is normal. No respiratory distress.     Breath sounds: Normal breath sounds.  Abdominal:     General: Abdomen is flat. Bowel sounds are normal. There is no distension.     Palpations: Abdomen is soft.     Tenderness: There is no abdominal tenderness.  Skin:    General: Skin is warm and dry.     Coloration: Skin is not jaundiced.  Neurological:     Mental Status: She is alert. Mental status is at baseline.     Coordination: Coordination normal.     ED Results / Procedures / Treatments   Labs (all labs ordered are listed, but only abnormal results are displayed) Labs Reviewed  RESP PANEL BY RT-PCR (RSV, FLU A&B, COVID)  RVPGX2 - Abnormal; Notable for the following components:      Result Value   Influenza A by PCR POSITIVE (*)  All other components within normal limits    EKG EKG Interpretation  Date/Time:  Thursday September 24 2022 18:29:25 EST Ventricular Rate:  114 PR Interval:  140 QRS Duration: 80 QT Interval:  330 QTC Calculation: 034 R Axis:   57 Text Interpretation: Sinus tachycardia Right atrial enlargement Nonspecific ST and T wave abnormality Abnormal ECG When compared with ECG of 25-Oct-2018 10:50, Since last tracing rate faster Confirmed by Wandra Arthurs [phone]) on 09/24/2022 6:32:53 PM  Radiology DG Chest 2 View  Result Date: 09/24/2022 CLINICAL DATA:  Short of breath, cough EXAM: CHEST - 2 VIEW COMPARISON:  10/25/2018 FINDINGS: The heart size and mediastinal contours are within normal limits. Both lungs are clear. The visualized skeletal structures are unremarkable. IMPRESSION: No active cardiopulmonary disease. Electronically Signed   By: Randa Ngo  M.D.   On: 09/24/2022 18:30    Procedures Procedures    Medications Ordered in ED Medications  albuterol (VENTOLIN HFA) 108 (90 Base) MCG/ACT inhaler 2 puff (has no administration in time range)    ED Course/ Medical Decision Making/ A&P                           Medical Decision Making Amount and/or Complexity of Data Reviewed Radiology: ordered.  Risk Prescription drug management.   Patient presents due to viral symptoms.  She is slightly tachycardic which is secondary most likely to her having an elevated temperature/fever.  Her lungs are clear to auscultation bilaterally, no tachypnea or hypoxia.  EKG shows sinus tachycardia without any specific ischemic changes.  Chest x-ray is negative for pneumonia.  Patient is positive for flu.  I spoke patient symptoms most likely secondary to flu.  Advise supportive care.  Stable for outpatient follow-up at this time.        Final Clinical Impression(s) / ED Diagnoses Final diagnoses:  None    Rx / DC Orders ED Discharge Orders     None         Sherrill Raring, Hershal Coria 09/24/22 2026    Drenda Freeze, MD 09/24/22 2128

## 2023-05-26 NOTE — Telephone Encounter (Signed)
Done

## 2024-02-22 ENCOUNTER — Other Ambulatory Visit: Payer: Self-pay

## 2024-02-22 ENCOUNTER — Encounter (HOSPITAL_BASED_OUTPATIENT_CLINIC_OR_DEPARTMENT_OTHER): Payer: Self-pay | Admitting: Urology

## 2024-02-22 ENCOUNTER — Emergency Department (HOSPITAL_BASED_OUTPATIENT_CLINIC_OR_DEPARTMENT_OTHER)
Admission: EM | Admit: 2024-02-22 | Discharge: 2024-02-22 | Disposition: A | Discharge status: Home / Self Care | Attending: Emergency Medicine | Admitting: Emergency Medicine

## 2024-02-22 ENCOUNTER — Emergency Department (HOSPITAL_BASED_OUTPATIENT_CLINIC_OR_DEPARTMENT_OTHER)

## 2024-02-22 DIAGNOSIS — J45909 Unspecified asthma, uncomplicated: Secondary | ICD-10-CM | POA: Insufficient documentation

## 2024-02-22 DIAGNOSIS — I10 Essential (primary) hypertension: Secondary | ICD-10-CM | POA: Diagnosis not present

## 2024-02-22 DIAGNOSIS — R0602 Shortness of breath: Secondary | ICD-10-CM | POA: Diagnosis present

## 2024-02-22 DIAGNOSIS — Z7982 Long term (current) use of aspirin: Secondary | ICD-10-CM | POA: Diagnosis not present

## 2024-02-22 DIAGNOSIS — J40 Bronchitis, not specified as acute or chronic: Secondary | ICD-10-CM

## 2024-02-22 LAB — RESP PANEL BY RT-PCR (RSV, FLU A&B, COVID)  RVPGX2
Influenza A by PCR: NEGATIVE
Influenza B by PCR: NEGATIVE
Resp Syncytial Virus by PCR: NEGATIVE
SARS Coronavirus 2 by RT PCR: NEGATIVE

## 2024-02-22 LAB — CBC
HCT: 38.5 % (ref 36.0–46.0)
Hemoglobin: 12.6 g/dL (ref 12.0–15.0)
MCH: 28.3 pg (ref 26.0–34.0)
MCHC: 32.7 g/dL (ref 30.0–36.0)
MCV: 86.5 fL (ref 80.0–100.0)
Platelets: 185 10*3/uL (ref 150–400)
RBC: 4.45 MIL/uL (ref 3.87–5.11)
RDW: 13.7 % (ref 11.5–15.5)
WBC: 4 10*3/uL (ref 4.0–10.5)
nRBC: 0 % (ref 0.0–0.2)

## 2024-02-22 LAB — BASIC METABOLIC PANEL WITH GFR
Anion gap: 11 (ref 5–15)
BUN: 9 mg/dL (ref 6–20)
CO2: 23 mmol/L (ref 22–32)
Calcium: 9.2 mg/dL (ref 8.9–10.3)
Chloride: 102 mmol/L (ref 98–111)
Creatinine, Ser: 0.75 mg/dL (ref 0.44–1.00)
GFR, Estimated: >60 mL/min (ref >60)
Glucose, Bld: 99 mg/dL (ref 70–99)
Potassium: 4 mmol/L (ref 3.5–5.1)
Sodium: 136 mmol/L (ref 135–145)

## 2024-02-22 MED ORDER — OLMESARTAN MEDOXOMIL 40 MG PO TABS: 40.0000 mg | ORAL_TABLET | Freq: Every day | ORAL | 1 refills | Status: AC

## 2024-02-22 MED ORDER — IRBESARTAN 150 MG PO TABS
300.0000 mg | ORAL_TABLET | Freq: Once | ORAL | Status: AC
  Administered 2024-02-22: 300 mg via ORAL
  Filled 2024-02-22: qty 2

## 2024-02-22 MED ORDER — ALBUTEROL SULFATE HFA 108 (90 BASE) MCG/ACT IN AERS
2.0000 | INHALATION_SPRAY | Freq: Once | RESPIRATORY_TRACT | Status: AC
  Administered 2024-02-22: 2 via RESPIRATORY_TRACT
  Filled 2024-02-22: qty 6.7

## 2024-02-22 MED ORDER — AZITHROMYCIN 250 MG PO TABS: ORAL_TABLET | ORAL | 0 refills | Status: AC

## 2024-02-22 NOTE — ED Provider Notes (Signed)
 [place] [facility] AT [facility] Provider Note   CSN: [id number] Arrival date & time: 02/22/24  2032     History  Chief Complaint  Patient presents with   Shortness of Breath    Rachael Paul is a 48 y.o. female history of hypertension, asthma here presenting with shortness of breath.  Patient states that she was with her grandson who recently was diagnosed with walking pneumonia.  Patient states that she has been having nonproductive cough and wheezing.  Patient has low-grade temperature as well.  Of note, patient was on BP meds before but has not been taking it for a year.  Per the note from cardiology, patient was supposed to be on chlorthalidone  but she did not remember being on it but remembers being on Benicar.  The history is provided by the patient.       Home Medications Prior to Admission medications   Medication Sig Start Date End Date Taking? Authorizing Provider  azithromycin (ZITHROMAX Z-PAK) 250 MG tablet 2 po day one, then 1 daily x 4 days 02/22/24  Yes Dalene Duck, MD  aspirin  81 MG chewable tablet Chew 1 tablet (81 mg total) by mouth daily. 10/26/18   Arneta Lango B, PA-C  cetirizine-pseudoephedrine (ZYRTEC-D) 5-120 MG tablet Take 1 tablet by mouth as needed.    [provider]  chlorthalidone  (HYGROTON ) 25 MG tablet TAKE 1 TABLET(25 MG) BY MOUTH EVERY MORNING 06/23/21   Tolia, Sunit, DO  nebivolol  (BYSTOLIC ) 10 MG tablet Take 10 mg by mouth daily.    [provider]  olmesartan (BENICAR) 40 MG tablet Take 1 tablet (40 mg total) by mouth daily. 02/22/24   Dalene Duck, MD  spironolactone  (ALDACTONE ) 25 MG tablet Take 25 mg by mouth daily.    [provider]      Allergies    Patient has no known allergies.    Review of Systems   Review of Systems  Respiratory:  Positive for shortness of breath.   All other systems reviewed and are negative.   Physical Exam Updated Vital Signs BP (!) 221/100  (BP Location: Right Arm)   Pulse (!) 102   Temp 99.5 F (37.5 C) (Oral)   Resp 20   Ht 5\' 4"  (1.626 m)   Wt 81.6 kg   SpO2 97%   BMI 30.88 kg/m  Physical Exam Vitals and nursing note reviewed.  Constitutional:      Comments: Slightly uncomfortable  HENT:     Head: Normocephalic.     Mouth/Throat:     Mouth: Mucous membranes are moist.  Eyes:     Extraocular Movements: Extraocular movements intact.     Pupils: Pupils are equal, round, and reactive to light.  Cardiovascular:     Rate and Rhythm: Normal rate and regular rhythm.  Pulmonary:     Comments: Diminished bilaterally.  Minimal wheezing. Abdominal:     General: Bowel sounds are normal.     Palpations: Abdomen is soft.  Musculoskeletal:        General: Normal range of motion.     Cervical back: Normal range of motion and neck supple.  Skin:    General: Skin is warm.     Capillary Refill: Capillary refill takes less than 2 seconds.  Neurological:     General: No focal deficit present.     Mental Status: She is alert and oriented to person, place, and time.  Psychiatric:        Mood  and Affect: Mood normal.        Behavior: Behavior normal.     ED Results / Procedures / Treatments   Labs (all labs ordered are listed, but only abnormal results are displayed) Labs Reviewed  RESP PANEL BY RT-PCR (RSV, FLU A&B, COVID)  RVPGX2  BASIC METABOLIC PANEL WITH GFR  CBC  PREGNANCY, URINE    EKG None  Radiology DG Chest 2 View Result Date: 02/22/2024 CLINICAL DATA:  Productive cough EXAM: CHEST - 2 VIEW COMPARISON:  09/24/2022 FINDINGS: Frontal and lateral views of the chest demonstrate a stable cardiac silhouette. No acute airspace disease, effusion, or pneumothorax. No acute bony abnormalities. IMPRESSION: 1. No acute intrathoracic process. Electronically Signed   By: Bobbye Burrow M.D.   On: 02/22/2024 21:02    Procedures Procedures    Medications Ordered in ED Medications  albuterol  (VENTOLIN  HFA) 108 (90  Base) MCG/ACT inhaler 2 puff (has no administration in time range)  irbesartan (AVAPRO) tablet 300 mg (has no administration in time range)    ED Course/ Medical Decision Making/ A&P                                 Medical Decision Making Rachael Paul is a 47 y.o. female here presenting with cough and shortness of breath.  Patient likely has bronchitis versus pneumonia.  Patient's blood pressure is also elevated and has not been compliant with her BP meds.  Patient did not remember what she was on and per the chart she was on Benicar at some point.  Will check CBC and BMP and chest x-ray and COVID and flu and RSV test.  Will give albuterol  and reassess  11:02 PM Reviewed patient's labs and white blood cell count is normal.  Chest x-ray showed no obvious pneumonia.  COVID and flu and RSV are negative.  Will give albuterol  as needed and also Z-Pak for possible atypical pneumonia.  I will refill her Benicar as well.   Problems Addressed: Bronchitis: acute illness or injury Hypertension, unspecified type: acute illness or injury  Amount and/or Complexity of Data Reviewed Labs: ordered. Decision-making details documented in ED Course. Radiology: ordered and independent interpretation performed. Decision-making details documented in ED Course.  Risk Prescription drug management.    Final Clinical Impression(s) / ED Diagnoses Final diagnoses:  None    Rx / DC Orders ED Discharge Orders          Ordered    olmesartan (BENICAR) 40 MG tablet  Daily        02/22/24 2250    azithromycin (ZITHROMAX Z-PAK) 250 MG tablet        02/22/24 2250              Dalene Duck, MD 02/22/24 2303

## 2024-02-22 NOTE — ED Triage Notes (Signed)
 Pt states productive cough and SOB for 2-3 days  SOB worse with exertion  H/o asthma, no inhaler at home   Family member has walking pneumonia  Has not taken BP meds for 1 yr, BP elevated in triage

## 2024-02-22 NOTE — Discharge Instructions (Addendum)
 You likely have bronchitis.  I have prescribed azithromycin which is an antibiotic to treat walking pneumonia  Please take Benicar daily for hypertension  Please use albuterol  as needed for cough or wheezing  You need to follow-up with primary care doctor or Dr. Albert Huff for follow up regarding your hypertension   Return to ER if you have worse cough or chest pain or fever
